# Patient Record
Sex: Female | Born: 1975 | Race: Black or African American | Hispanic: No | Marital: Single | Smoking: Never smoker
Health system: Southern US, Community
[De-identification: ages and names within clinical notes are randomized; demographics above are authoritative.]

## PROBLEM LIST (undated history)

## (undated) DIAGNOSIS — I82409 Acute embolism and thrombosis of unspecified deep veins of unspecified lower extremity: Secondary | ICD-10-CM

## (undated) DIAGNOSIS — K5900 Constipation, unspecified: Secondary | ICD-10-CM

## (undated) DIAGNOSIS — R5383 Other fatigue: Secondary | ICD-10-CM

## (undated) DIAGNOSIS — R12 Heartburn: Secondary | ICD-10-CM

## (undated) DIAGNOSIS — R0602 Shortness of breath: Secondary | ICD-10-CM

## (undated) DIAGNOSIS — E559 Vitamin D deficiency, unspecified: Secondary | ICD-10-CM

## (undated) DIAGNOSIS — M25569 Pain in unspecified knee: Secondary | ICD-10-CM

## (undated) HISTORY — DX: Heartburn: R12

## (undated) HISTORY — DX: Acute embolism and thrombosis of unspecified deep veins of unspecified lower extremity: I82.409

## (undated) HISTORY — DX: Constipation, unspecified: K59.00

## (undated) HISTORY — DX: Vitamin D deficiency, unspecified: E55.9

## (undated) HISTORY — DX: Other fatigue: R53.83

## (undated) HISTORY — PX: BREAST REDUCTION SURGERY: SHX8

## (undated) HISTORY — PX: BREAST CYST EXCISION: SHX579

## (undated) HISTORY — PX: PARTIAL HYSTERECTOMY: SHX80

## (undated) HISTORY — DX: Pain in unspecified knee: M25.569

## (undated) HISTORY — DX: Shortness of breath: R06.02

---

## 2017-09-12 DIAGNOSIS — Z719 Counseling, unspecified: Secondary | ICD-10-CM | POA: Diagnosis not present

## 2017-11-07 ENCOUNTER — Other Ambulatory Visit: Payer: Self-pay | Admitting: Obstetrics and Gynecology

## 2017-11-07 DIAGNOSIS — Z1231 Encounter for screening mammogram for malignant neoplasm of breast: Secondary | ICD-10-CM

## 2017-11-26 DIAGNOSIS — Z124 Encounter for screening for malignant neoplasm of cervix: Secondary | ICD-10-CM | POA: Diagnosis not present

## 2017-11-26 DIAGNOSIS — Z01419 Encounter for gynecological examination (general) (routine) without abnormal findings: Secondary | ICD-10-CM | POA: Diagnosis not present

## 2017-11-26 DIAGNOSIS — Z6833 Body mass index (BMI) 33.0-33.9, adult: Secondary | ICD-10-CM | POA: Diagnosis not present

## 2017-12-06 DIAGNOSIS — J019 Acute sinusitis, unspecified: Secondary | ICD-10-CM | POA: Diagnosis not present

## 2017-12-19 ENCOUNTER — Ambulatory Visit
Admission: RE | Admit: 2017-12-19 | Discharge: 2017-12-19 | Disposition: A | Discharge status: Home / Self Care | Payer: 59 | Source: Ambulatory Visit | Attending: Obstetrics and Gynecology | Admitting: Obstetrics and Gynecology

## 2017-12-19 ENCOUNTER — Encounter: Payer: Self-pay | Admitting: Radiology

## 2017-12-19 DIAGNOSIS — Z1231 Encounter for screening mammogram for malignant neoplasm of breast: Secondary | ICD-10-CM

## 2018-01-15 DIAGNOSIS — J069 Acute upper respiratory infection, unspecified: Secondary | ICD-10-CM | POA: Diagnosis not present

## 2018-01-19 DIAGNOSIS — R091 Pleurisy: Secondary | ICD-10-CM | POA: Diagnosis not present

## 2018-01-19 DIAGNOSIS — J069 Acute upper respiratory infection, unspecified: Secondary | ICD-10-CM | POA: Diagnosis not present

## 2018-02-05 DIAGNOSIS — J209 Acute bronchitis, unspecified: Secondary | ICD-10-CM | POA: Diagnosis not present

## 2018-02-05 DIAGNOSIS — K649 Unspecified hemorrhoids: Secondary | ICD-10-CM | POA: Diagnosis not present

## 2018-02-17 ENCOUNTER — Encounter (HOSPITAL_COMMUNITY): Payer: Self-pay | Admitting: Emergency Medicine

## 2018-02-17 ENCOUNTER — Emergency Department (HOSPITAL_COMMUNITY): Payer: 59

## 2018-02-17 ENCOUNTER — Emergency Department (HOSPITAL_COMMUNITY)
Admission: EM | Admit: 2018-02-17 | Discharge: 2018-02-17 | Disposition: A | Discharge status: Home / Self Care | Payer: 59 | Attending: Emergency Medicine | Admitting: Emergency Medicine

## 2018-02-17 DIAGNOSIS — M940 Chondrocostal junction syndrome [Tietze]: Secondary | ICD-10-CM | POA: Insufficient documentation

## 2018-02-17 DIAGNOSIS — R05 Cough: Secondary | ICD-10-CM | POA: Diagnosis not present

## 2018-02-17 MED ORDER — NAPROXEN SODIUM 550 MG PO TABS: 550.0000 mg | ORAL_TABLET | Freq: Two times a day (BID) | ORAL | 0 refills | Status: AC

## 2018-02-17 NOTE — ED Notes (Signed)
Patient verbalized understanding of dc instructions, vss, ambulatory with nad.   

## 2018-02-17 NOTE — ED Provider Notes (Signed)
[facility] EMERGENCY DEPARTMENT Provider Note   CSN: [id number] Arrival date & time: 02/17/18  0807     History   Chief Complaint Chief Complaint  Patient presents with  . Cough    HPI Jessica Frye is a 43 y.o. female.  43 year old female presents with complaint of pain in her ribs with coughing and sneezing, located right and left lower ribs, no pain at rest (only occurs with coughing/sneezing).  Patient states that she has had a cough for the past month, reports onset with postnasal drip as a general cold which progressed into worsening cough.  Patient did a virtual health visit and was given cough medicine.  Patient did a second virtual visit when her cough was persisting and was given a round of steroids.  Patient went to urgent care for ongoing cough was diagnosed with bronchitis and given additional steroids and antibiotics.  Patient states cough is not productive, denies associated fevers, chills, sweats.  Patient reports slight wheezing only with prolonged coughing episodes.  Non-smoker. No other complaints or concerns.      History reviewed. No pertinent past medical history.  There are no active problems to display for this patient.   Past Surgical History:  Procedure Laterality Date  . BREAST CYST EXCISION Right      OB History   No obstetric history on file.      Home Medications    Prior to Admission medications   Medication Sig Start Date End Date Taking? Authorizing Provider  naproxen sodium (ANAPROX) 550 MG tablet Take 1 tablet (550 mg total) by mouth 2 (two) times daily with a meal for 10 days. 02/17/18 02/27/18  Jeannie Fend, PA-C    Family History Family History  Problem Relation Age of Onset  . Breast cancer Neg Hx     Social History Social History   Tobacco Use  . Smoking status: Not on file  Substance Use Topics  . Alcohol use: Not on file  . Drug use: Not on file     Allergies   Patient has no known  allergies.   Review of Systems Review of Systems  Constitutional: Negative for chills and fever.  HENT: Positive for sneezing. Negative for congestion, ear pain, postnasal drip, rhinorrhea, sinus pressure, sinus pain, sore throat, trouble swallowing and voice change.   Eyes: Negative for discharge and redness.  Respiratory: Positive for cough and wheezing. Negative for shortness of breath.   Musculoskeletal: Negative for arthralgias and myalgias.  Skin: Negative for rash and wound.  Allergic/Immunologic: Negative for immunocompromised state.  Neurological: Negative for weakness and headaches.  Hematological: Negative for adenopathy.  All other systems reviewed and are negative.    Physical Exam Updated Vital Signs BP 111/79   Pulse 84   Temp 98.3 F (36.8 C) (Oral)   Resp 16   SpO2 100%   Physical Exam Vitals signs and nursing note reviewed.  Constitutional:      General: She is not in acute distress.    Appearance: She is well-developed. She is not diaphoretic.  HENT:     Head: Normocephalic and atraumatic.     Nose: Nose normal.     Mouth/Throat:     Mouth: Mucous membranes are moist.  Eyes:     Conjunctiva/sclera: Conjunctivae normal.  Cardiovascular:     Rate and Rhythm: Normal rate and regular rhythm.     Pulses: Normal pulses.     Heart sounds: Normal heart sounds. No murmur.  Pulmonary:  Effort: Pulmonary effort is normal.     Breath sounds: Normal breath sounds. No wheezing.  Chest:     Chest wall: Tenderness present.    Abdominal:     Tenderness: There is no abdominal tenderness.  Skin:    General: Skin is warm and dry.     Findings: No erythema or rash.  Neurological:     Mental Status: She is alert and oriented to person, place, and time.  Psychiatric:        Behavior: Behavior normal.      ED Treatments / Results  Labs (all labs ordered are listed, but only abnormal results are displayed) Labs Reviewed - No data to  display  EKG None  Radiology Dg Chest 2 View  Result Date: 02/17/2018 CLINICAL DATA:  One-week history of cough, shows pain and shortness of breath. EXAM: CHEST - 2 VIEW COMPARISON:  None. FINDINGS: Cardiomediastinal silhouette unremarkable. Lungs clear. Bronchovascular markings normal. Pulmonary vascularity normal. No pneumothorax. No pleural effusions. Visualized bony thorax intact. IMPRESSION: Normal examination. Electronically Signed   By: Hulan Saas M.D.   On: 02/17/2018 09:17    Procedures Procedures (including critical care time)  Medications Ordered in ED Medications - No data to display   Initial Impression / Assessment and Plan / ED Course  I have reviewed the triage vital signs and the nursing notes.  Pertinent labs & imaging results that were available during my care of the patient were reviewed by me and considered in my medical decision making (see chart for details).  Clinical Course as of Feb 17 933  Sun Feb 17, 2018  2970 43 year old female with cough x1 month now with chest wall soreness.  Pain reproduced with coughing, sneezing, palpation.  X-ray unremarkable.  Patient given course of naproxen to take for likely costochondritis and advised to follow-up with her PCP.   [LM]    Clinical Course User Index [LM] Jeannie Fend, PA-C   Final Clinical Impressions(s) / ED Diagnoses   Final diagnoses:  Costochondritis    ED Discharge Orders         Ordered    naproxen sodium (ANAPROX) 550 MG tablet  2 times daily with meals     02/17/18 0933           Jeannie Fend, PA-C 02/17/18 0935    Virgina Norfolk, DO 02/17/18 [phone]

## 2018-02-17 NOTE — ED Notes (Signed)
Patient transported to X-ray 

## 2018-02-17 NOTE — Discharge Instructions (Addendum)
Your chest x-ray today is normal.  Your pain is likely coming from muscle soreness or inflammation of the cartilage in your ribs.  You should take naproxen as prescribed for 10 days.  Take this medication with food.  Follow-up with your doctor this week.

## 2018-02-17 NOTE — ED Triage Notes (Signed)
Pt reports seen multiple times over the past 4 weeks for a cough that was productive and is now dry. States that her chest is sore now from coughing so much. States she was initially given tessalon for cough, when it did not subside she went to urgent care where she was told it was related to allergies, given steroids, cough did not subside,  2 weeks later diagnosed with bronchitis, given another course of steroids and abx. She reports cough is still persisting.

## 2018-02-19 DIAGNOSIS — E559 Vitamin D deficiency, unspecified: Secondary | ICD-10-CM | POA: Diagnosis not present

## 2018-02-19 DIAGNOSIS — R05 Cough: Secondary | ICD-10-CM | POA: Diagnosis not present

## 2018-02-19 DIAGNOSIS — R5383 Other fatigue: Secondary | ICD-10-CM | POA: Diagnosis not present

## 2018-03-28 DIAGNOSIS — J019 Acute sinusitis, unspecified: Secondary | ICD-10-CM | POA: Diagnosis not present

## 2018-04-19 DIAGNOSIS — J018 Other acute sinusitis: Secondary | ICD-10-CM | POA: Diagnosis not present

## 2018-12-09 ENCOUNTER — Other Ambulatory Visit: Payer: Self-pay | Admitting: Obstetrics and Gynecology

## 2018-12-09 DIAGNOSIS — Z1231 Encounter for screening mammogram for malignant neoplasm of breast: Secondary | ICD-10-CM

## 2019-01-02 ENCOUNTER — Ambulatory Visit: Payer: 59

## 2019-03-14 ENCOUNTER — Ambulatory Visit: Payer: 59 | Attending: Internal Medicine

## 2019-03-14 DIAGNOSIS — Z23 Encounter for immunization: Secondary | ICD-10-CM

## 2019-03-14 NOTE — Progress Notes (Signed)
   Covid-19 Vaccination Clinic  Name:  Jessica Frye    MRN: [id number] DOB: August 12, 1975  03/14/2019  [name] was observed post Covid-19 immunization for 15 minutes without incident. She was provided with Vaccine Information Sheet and instruction to access the V-Safe system.   [name] was instructed to call 911 with any severe reactions post vaccine: Marland Kitchen Difficulty breathing  . Swelling of face and throat  . A fast heartbeat  . A bad rash all over body  . Dizziness and weakness   Immunizations Administered    Name Date Dose VIS Date Route   Pfizer COVID-19 Vaccine 03/14/2019  9:13 AM 0.3 mL 12/13/2018 Intramuscular   Manufacturer: [employer], [employer]   Lot: [id number]   NDC: [address]-3

## 2019-04-03 ENCOUNTER — Ambulatory Visit: Payer: 59

## 2019-04-07 ENCOUNTER — Ambulatory Visit: Payer: 59 | Attending: Internal Medicine

## 2019-04-07 DIAGNOSIS — Z23 Encounter for immunization: Secondary | ICD-10-CM

## 2019-04-07 NOTE — Progress Notes (Signed)
   Covid-19 Vaccination Clinic  Name:  Tameaka Eichhorn    MRN: [id number] DOB: March 31, 1975  04/07/2019  Ms. Kirkey was observed post Covid-19 immunization for 15 minutes without incident. She was provided with Vaccine Information Sheet and instruction to access the V-Safe system.   Ms. Rzasa was instructed to call 911 with any severe reactions post vaccine: Marland Kitchen Difficulty breathing  . Swelling of face and throat  . A fast heartbeat  . A bad rash all over body  . Dizziness and weakness   Immunizations Administered    Name Date Dose VIS Date Route   Pfizer COVID-19 Vaccine 04/07/2019  9:48 AM 0.3 mL 12/13/2018 Intramuscular   Manufacturer: [employer], [employer]   Lot: [id number]   NDC: [address]-9

## 2019-08-22 ENCOUNTER — Ambulatory Visit: Payer: 59

## 2019-09-05 IMAGING — MG DIGITAL SCREENING BILATERAL MAMMOGRAM WITH TOMO AND CAD
6 of 10 series · 6 of 30 positions shown · non-contrast
Comparison: Previous exam(s).

CLINICAL DATA: Screening.

EXAM:
DIGITAL SCREENING BILATERAL MAMMOGRAM WITH TOMO AND CAD

[R CC synth-2D (1 of 2)]
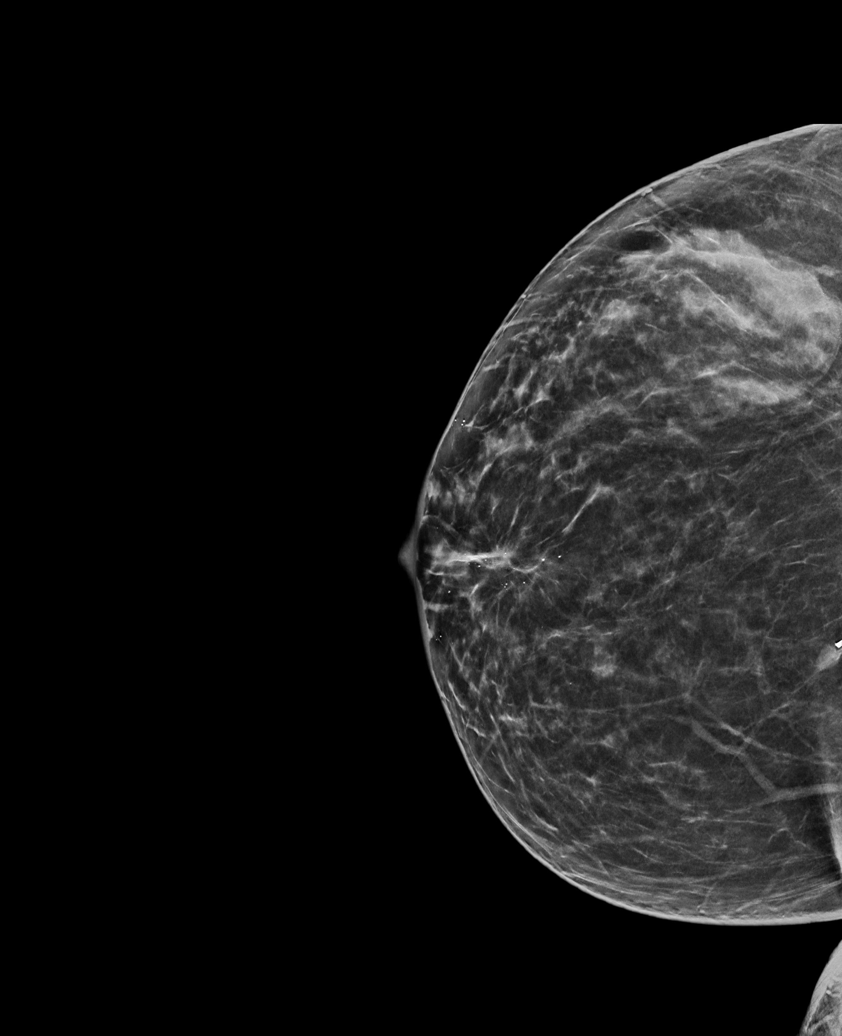

[R CC synth-2D (2 of 2)]
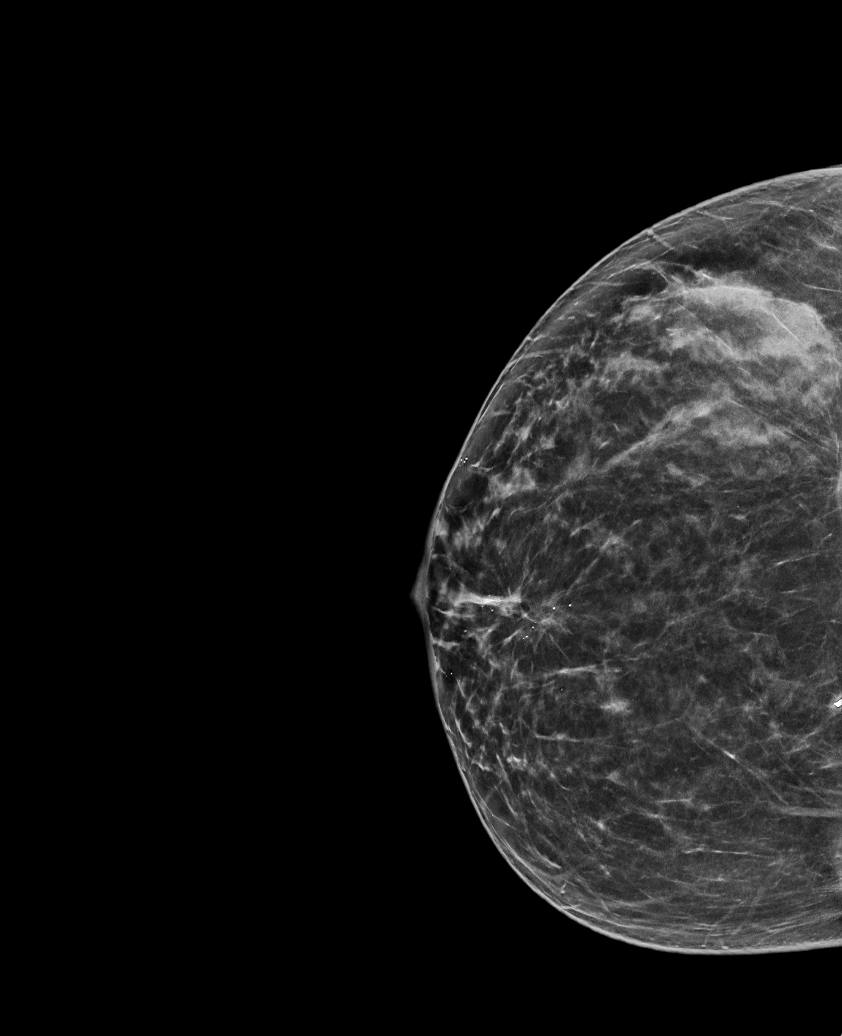

[L MLO synth-2D]
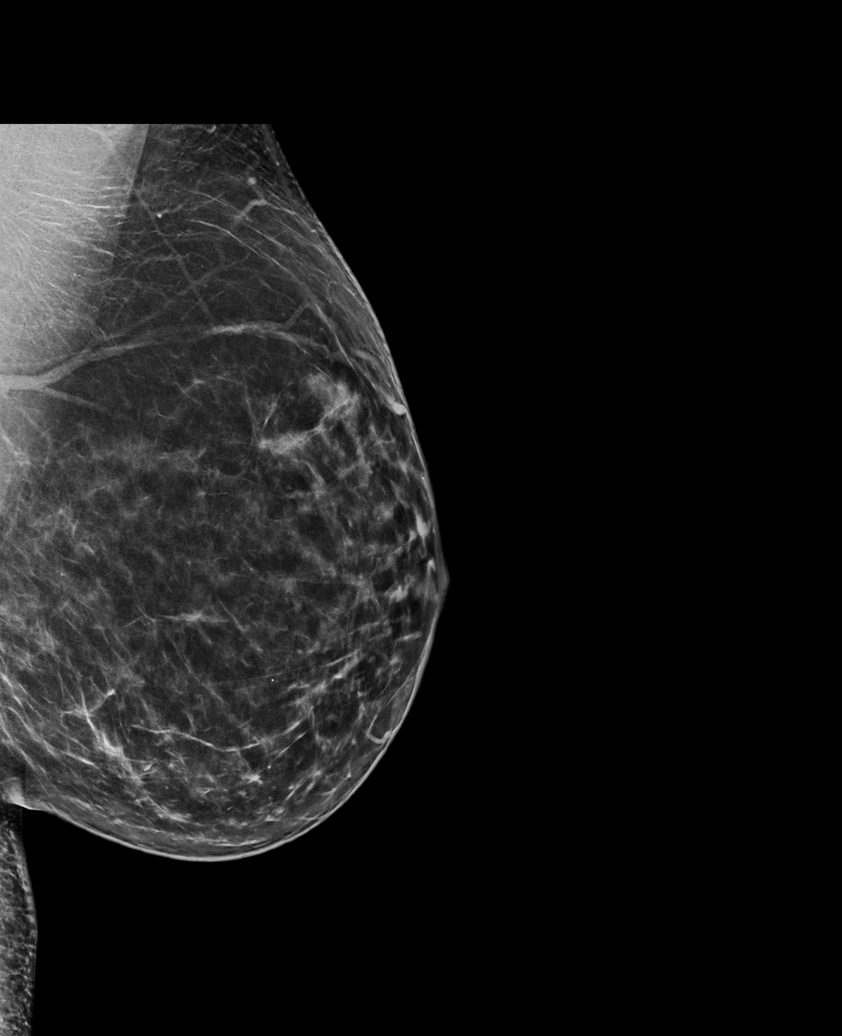

[R MLO synth-2D]
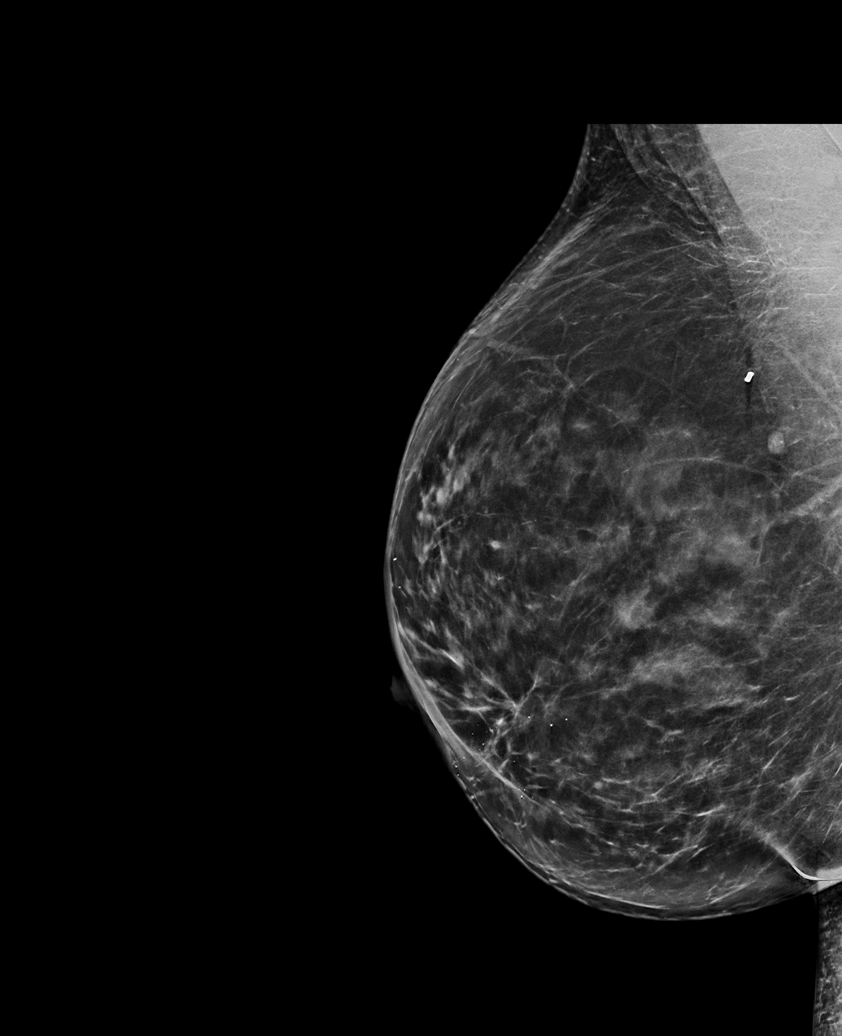

[L CC synth-2D]
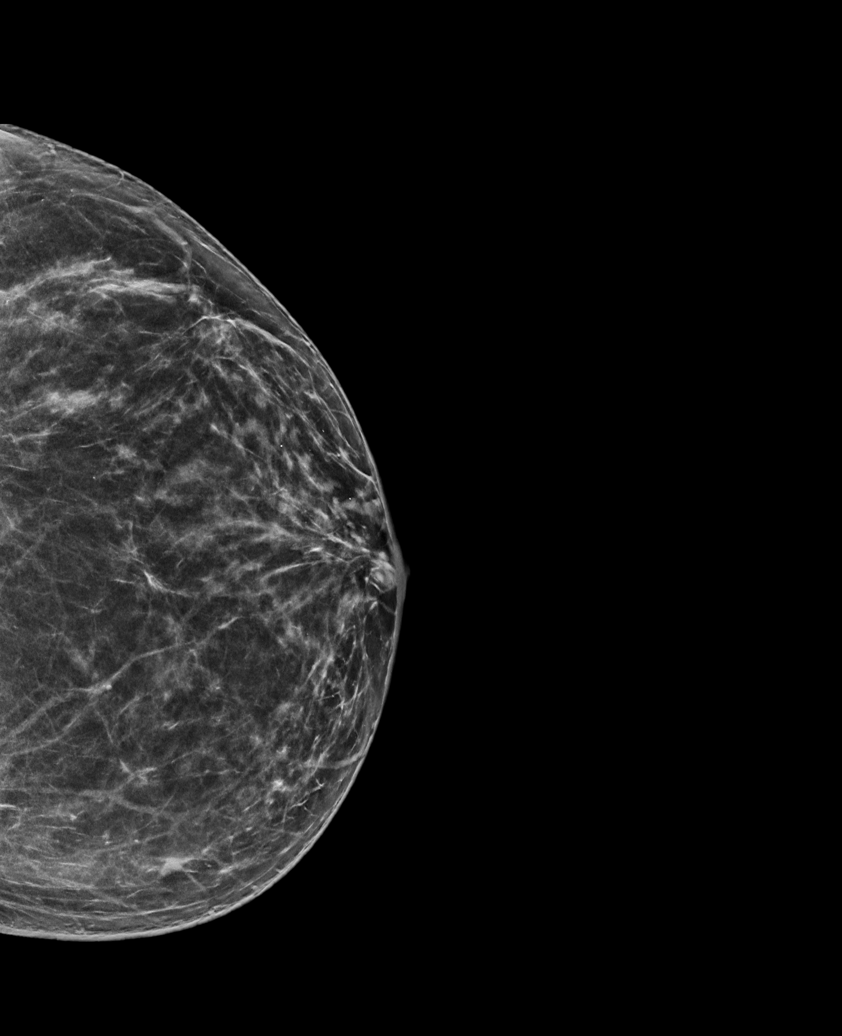

[L CC tomo · tomo slice 35/70.0]
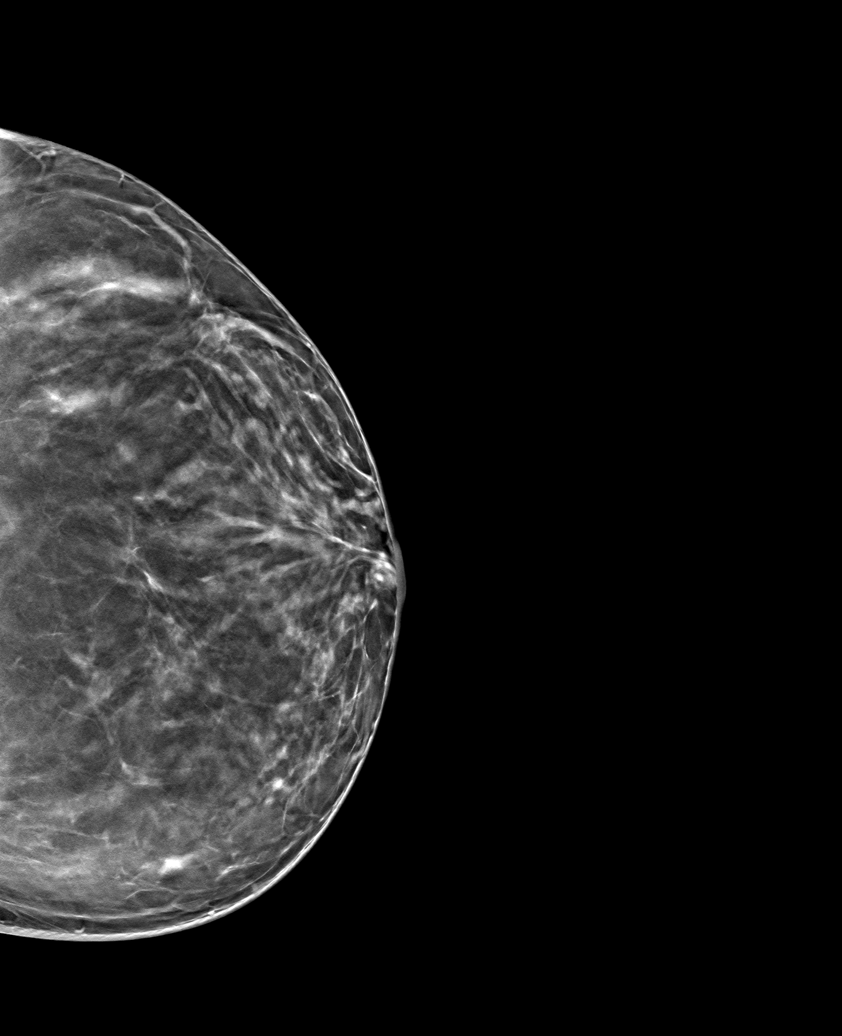

[6 of 30 positions shown; findings below may reference images not displayed]

ACR Breast Density Category b: There are scattered areas of
fibroglandular density.
FINDINGS: There are no findings suspicious for malignancy. Images were
processed with CAD.
IMPRESSION: No mammographic evidence of malignancy. A result letter of this
screening mammogram will be mailed directly to the patient.

RECOMMENDATION:
Screening mammogram in one year. (Code:[id number])

BI-RADS CATEGORY  1: Negative.

## 2019-09-09 ENCOUNTER — Other Ambulatory Visit: Payer: Self-pay

## 2019-09-09 ENCOUNTER — Ambulatory Visit
Admission: RE | Admit: 2019-09-09 | Discharge: 2019-09-09 | Disposition: A | Discharge status: Home / Self Care | Payer: 59 | Source: Ambulatory Visit | Attending: Obstetrics and Gynecology | Admitting: Obstetrics and Gynecology

## 2019-09-09 DIAGNOSIS — Z1231 Encounter for screening mammogram for malignant neoplasm of breast: Secondary | ICD-10-CM

## 2020-03-30 ENCOUNTER — Emergency Department (HOSPITAL_BASED_OUTPATIENT_CLINIC_OR_DEPARTMENT_OTHER): Payer: 59

## 2020-03-30 ENCOUNTER — Encounter (HOSPITAL_BASED_OUTPATIENT_CLINIC_OR_DEPARTMENT_OTHER): Payer: Self-pay | Admitting: Emergency Medicine

## 2020-03-30 ENCOUNTER — Other Ambulatory Visit: Payer: Self-pay

## 2020-03-30 ENCOUNTER — Emergency Department (HOSPITAL_BASED_OUTPATIENT_CLINIC_OR_DEPARTMENT_OTHER)
Admission: EM | Admit: 2020-03-30 | Discharge: 2020-03-30 | Disposition: A | Discharge status: Home / Self Care | Payer: 59 | Attending: Emergency Medicine | Admitting: Emergency Medicine

## 2020-03-30 DIAGNOSIS — I82442 Acute embolism and thrombosis of left tibial vein: Secondary | ICD-10-CM | POA: Diagnosis not present

## 2020-03-30 DIAGNOSIS — M79662 Pain in left lower leg: Secondary | ICD-10-CM | POA: Diagnosis present

## 2020-03-30 LAB — CBC WITH DIFFERENTIAL/PLATELET
Abs Immature Granulocytes: 0.01 10*3/uL (ref 0.00–0.07)
Basophils Absolute: 0 10*3/uL (ref 0.0–0.1)
Basophils Relative: 1 %
Eosinophils Absolute: 0.1 10*3/uL (ref 0.0–0.5)
Eosinophils Relative: 3 %
HCT: 39.9 % (ref 36.0–46.0)
Hemoglobin: 13.1 g/dL (ref 12.0–15.0)
Immature Granulocytes: 0 %
Lymphocytes Relative: 36 %
Lymphs Abs: 1.7 10*3/uL (ref 0.7–4.0)
MCH: 28.6 pg (ref 26.0–34.0)
MCHC: 32.8 g/dL (ref 30.0–36.0)
MCV: 87.1 fL (ref 80.0–100.0)
Monocytes Absolute: 0.3 10*3/uL (ref 0.1–1.0)
Monocytes Relative: 7 %
Neutro Abs: 2.6 10*3/uL (ref 1.7–7.7)
Neutrophils Relative %: 53 %
Platelets: 323 10*3/uL (ref 150–400)
RBC: 4.58 MIL/uL (ref 3.87–5.11)
RDW: 11.9 % (ref 11.5–15.5)
WBC: 4.8 10*3/uL (ref 4.0–10.5)
nRBC: 0 % (ref 0.0–0.2)

## 2020-03-30 LAB — BASIC METABOLIC PANEL
Anion gap: 7 (ref 5–15)
BUN: 11 mg/dL (ref 6–20)
CO2: 27 mmol/L (ref 22–32)
Calcium: 9.9 mg/dL (ref 8.9–10.3)
Chloride: 103 mmol/L (ref 98–111)
Creatinine, Ser: 0.76 mg/dL (ref 0.44–1.00)
GFR, Estimated: >60 mL/min (ref >60)
Glucose, Bld: 84 mg/dL (ref 70–99)
Potassium: 3.8 mmol/L (ref 3.5–5.1)
Sodium: 137 mmol/L (ref 135–145)

## 2020-03-30 MED ORDER — RIVAROXABAN (XARELTO) VTE STARTER PACK (15 & 20 MG): ORAL_TABLET | ORAL | 0 refills | Status: DC

## 2020-03-30 MED ORDER — RIVAROXABAN 15 MG PO TABS
15.0000 mg | ORAL_TABLET | Freq: Once | ORAL | Status: AC
  Administered 2020-03-30: 15 mg via ORAL
  Filled 2020-03-30: qty 1

## 2020-03-30 NOTE — ED Notes (Signed)
Patient transported to Ultrasound 

## 2020-03-30 NOTE — ED Provider Notes (Signed)
[facility] EMERGENCY DEPT Provider Note   CSN: [id number] Arrival date & time: 03/30/20  1713     History Chief Complaint  Patient presents with  . Leg Pain    Jessica Frye is a 45 y.o. female.  Jessica Frye reports that she was getting into bed about 4 days ago when her left calf muscle began to cramp.  Ever since then she has had persistent pain in this leg.  She denies any changes to her routines.  She has not engaged in strenuous exercise.  No history of DVT or PE.  The history is provided by the patient.  Leg Pain Location:  Leg Leg location:  L lower leg Pain details:    Quality:  Cramping   Radiates to:  Does not radiate   Severity:  Moderate   Onset quality:  Sudden   Duration:  4 days   Timing:  Constant   Progression:  Unchanged Chronicity:  New Dislocation: no   Prior injury to area:  No Relieved by:  Rest Worsened by:  Bearing weight Associated symptoms: no back pain, no decreased ROM, no fever, no numbness and no swelling        History reviewed. No pertinent past medical history.  There are no problems to display for this patient.   Past Surgical History:  Procedure Laterality Date  . BREAST CYST EXCISION Right      OB History   No obstetric history on file.     Family History  Problem Relation Age of Onset  . Breast cancer Neg Hx     Social History   Substance Use Topics  . Alcohol use: Not Currently    Home Medications Prior to Admission medications   Not on File    Allergies    Patient has no known allergies.  Review of Systems   Review of Systems  Constitutional: Negative for chills and fever.  HENT: Negative for ear pain and sore throat.   Eyes: Negative for pain and visual disturbance.  Respiratory: Negative for cough and shortness of breath.   Cardiovascular: Negative for chest pain and palpitations.  Gastrointestinal: Negative for abdominal pain and vomiting.  Genitourinary: Negative for dysuria and  hematuria.  Musculoskeletal: Negative for arthralgias and back pain.  Skin: Negative for color change and rash.  Neurological: Negative for seizures and syncope.  All other systems reviewed and are negative.   Physical Exam Updated Vital Signs BP 132/85 (BP Location: Left Arm)   Pulse 80   Temp 98.2 F (36.8 C) (Oral)   Resp 18   Ht 5\' 2"  (1.575 m)   Wt 73.5 kg   SpO2 100%   BMI 29.63 kg/m   Physical Exam Vitals and nursing note reviewed.  HENT:     Head: Normocephalic and atraumatic.  Eyes:     General: No scleral icterus. Pulmonary:     Effort: Pulmonary effort is normal. No respiratory distress.  Musculoskeletal:     Cervical back: Normal range of motion.       Legs:     Comments: Tenderness to palpation at the posterior aspect of the left calf.  No muscular defect noted.  No tenderness to palpation about the Achilles tendon.  Ankle range of motion is normal, and dorsiflexion more so than plantar flexion produces some mild pain.  Dorsalis pedis and posterior tibialis pulses are normal.  No obvious edema.  No swelling in the popliteal fossa.  Skin:    General: Skin is warm  and dry.  Neurological:     Mental Status: She is alert.  Psychiatric:        Mood and Affect: Mood normal.     ED Results / Procedures / Treatments   Labs (all labs ordered are listed, but only abnormal results are displayed) Labs Reviewed  BASIC METABOLIC PANEL  CBC WITH DIFFERENTIAL/PLATELET    EKG None  Radiology US Venous Img Lower  Left (DVT Study)  Addendum Date: 03/30/2020   ADDENDUM REPORT: 03/30/2020 19:33 ADDENDUM: These results were called by telephone at the time of physician contact on 03/30/2020 at 7:33 pm to provider [facility] , who verbally acknowledged these results. Electronically Signed   By: Kreg Shropshire M.D.   On: 03/30/2020 19:33   Result Date: 03/30/2020 CLINICAL DATA:  Left leg swell EXAM: LEFT LOWER EXTREMITY VENOUS DOPPLER ULTRASOUND TECHNIQUE: Gray-scale  sonography with compression, as well as color and duplex ultrasound, were performed to evaluate the deep venous system(s) from the level of the common femoral vein through the popliteal and proximal calf veins. COMPARISON:  None. FINDINGS: VENOUS Corresponding well to the patient's area indicated discomfort is a focal the noncompressible segment of a posterior tibial vein. Normal compressibility of the common femoral, superficial femoral, and popliteal veins, as well as the remaining visualized calf veins. Visualized portions of profunda femoral vein and great saphenous vein unremarkable. No filling defects to suggest DVT on grayscale or color Doppler imaging. Doppler waveforms show normal direction of venous flow, normal respiratory plasticity and response to augmentation. Limited views of the contralateral common femoral vein are unremarkable. OTHER None. Limitations: none IMPRESSION: Segmental noncompressibility of the posterior tibial vein at the calf corresponding to patient's area of concern could reflect a small deep venous thrombosis within 1 of 2 left posterior tibial veins. No other visible femoropopliteal DVT or additional filling defects within the remaining calf veins in the left lower extremity. Electronically Signed: By: Kreg Shropshire M.D. On: 03/30/2020 19:25    Procedures Procedures   Medications Ordered in ED Medications  Rivaroxaban (XARELTO) tablet 15 mg (has no administration in time range)    ED Course  I have reviewed the triage vital signs and the nursing notes.  Pertinent labs & imaging results that were available during my care of the patient were reviewed by me and considered in my medical decision making (see chart for details).    MDM Rules/Calculators/A&P                          Jessica Frye presents with left posterior leg pain.  Initially I was considering a musculoskeletal cause such as muscle cramping, microscopic tearing of the musculature, or even Achilles  tendon pathology.  Nonetheless, I do want to exclude DVT.  Ultrasound returned positive for DVT.  Labs were then ordered to make sure the patient did not have any renal failure.  Labs were within normal limits.  She was given a dose of Xarelto here, she was discharged home with a prescription.  I talked to her extensively about the risk and benefits of anticoagulation.  She agrees to follow-up with her doctor. Final Clinical Impression(s) / ED Diagnoses Final diagnoses:  Acute deep vein thrombosis (DVT) of tibial vein of left lower extremity (HCC)    Rx / DC Orders ED Discharge Orders         Ordered    RIVAROXABAN (XARELTO) VTE STARTER PACK (15 & 20 MG)  03/30/20 2100           Koleen Distance, MD 03/30/20 2102

## 2020-03-30 NOTE — ED Triage Notes (Signed)
Pt stated that she is experiencing constant pain in her left calf for the past three days. Pt states that her calf is not hot to the touch and is unsure if there is any swelling

## 2020-03-31 ENCOUNTER — Emergency Department (HOSPITAL_BASED_OUTPATIENT_CLINIC_OR_DEPARTMENT_OTHER)
Admission: EM | Admit: 2020-03-31 | Discharge: 2020-03-31 | Disposition: A | Discharge status: Home / Self Care | Payer: 59 | Attending: Emergency Medicine | Admitting: Emergency Medicine

## 2020-03-31 ENCOUNTER — Encounter (HOSPITAL_BASED_OUTPATIENT_CLINIC_OR_DEPARTMENT_OTHER): Payer: Self-pay | Admitting: Emergency Medicine

## 2020-03-31 ENCOUNTER — Other Ambulatory Visit: Payer: Self-pay

## 2020-03-31 DIAGNOSIS — Z7901 Long term (current) use of anticoagulants: Secondary | ICD-10-CM | POA: Diagnosis not present

## 2020-03-31 DIAGNOSIS — I82890 Acute embolism and thrombosis of other specified veins: Secondary | ICD-10-CM

## 2020-03-31 DIAGNOSIS — M79605 Pain in left leg: Secondary | ICD-10-CM | POA: Diagnosis present

## 2020-03-31 DIAGNOSIS — I82812 Embolism and thrombosis of superficial veins of left lower extremities: Secondary | ICD-10-CM | POA: Insufficient documentation

## 2020-03-31 NOTE — ED Triage Notes (Signed)
Worsening left cal pain, Dx DVT, sent by PCP for arterial doppler for possible occlusion.

## 2020-03-31 NOTE — ED Provider Notes (Signed)
[facility] EMERGENCY DEPT Provider Note   CSN: [id number] Arrival date & time: 03/31/20  1143     History Chief Complaint  Patient presents with  . Leg Pain    Jessica Frye is a 45 y.o. female.  45 yo F with a chief complaints of leg pain.  This been off and on for some time now.  She was seen yesterday in the emergency department and found to have a superficial thrombophlebitis.  Decision was made to anticoagulate her.  She was followed up with her primary doctor today who was unable to palpate pulses and so sent her here to rule out acute arterial occlusion.  She denies any significant change in her pain over the past couple days.  Pain usually worse with bearing weight and ambulating.  The history is provided by the patient.  Leg Pain Location:  Leg Time since incident:  2 months Injury: no   Leg location:  L leg Pain details:    Quality:  Cramping and aching   Radiates to:  Does not radiate   Severity:  Moderate   Onset quality:  Gradual   Duration:  2 months   Timing:  Intermittent   Progression:  Waxing and waning Chronicity:  New Prior injury to area:  No Relieved by:  Nothing Worsened by:  Nothing Ineffective treatments:  None tried Associated symptoms: no fever        History reviewed. No pertinent past medical history.  There are no problems to display for this patient.   Past Surgical History:  Procedure Laterality Date  . BREAST CYST EXCISION Right      OB History   No obstetric history on file.     Family History  Problem Relation Age of Onset  . Breast cancer Neg Hx     Social History   Substance Use Topics  . Alcohol use: Not Currently    Home Medications Prior to Admission medications   Medication Sig Start Date End Date Taking? Authorizing Provider  RIVAROXABAN Carlena Hurl) VTE STARTER PACK (15 & 20 MG) Follow package directions: Take one 15mg  tablet by mouth twice a day. On day 22, switch to one 20mg  tablet once a  day. Take with food. 03/30/20   , MD    Allergies    Patient has no known allergies.  Review of Systems   Review of Systems  Constitutional: Negative for chills and fever.  HENT: Negative for congestion and rhinorrhea.   Eyes: Negative for redness and visual disturbance.  Respiratory: Negative for shortness of breath and wheezing.   Cardiovascular: Negative for chest pain and palpitations.  Gastrointestinal: Negative for nausea and vomiting.  Genitourinary: Negative for dysuria and urgency.  Musculoskeletal: Positive for myalgias. Negative for arthralgias.  Skin: Negative for pallor and wound.  Neurological: Negative for dizziness and headaches.    Physical Exam Updated Vital Signs BP 112/85   Pulse 80   Temp 98.6 F (37 C) (Oral)   Resp 16   Ht 5\' 2"  (1.575 m)   Wt 78 kg   SpO2 100%   BMI 31.46 kg/m   Physical Exam Vitals and nursing note reviewed.  Constitutional:      General: She is not in acute distress.    Appearance: She is well-developed. She is not diaphoretic.  HENT:     Head: Normocephalic and atraumatic.  Eyes:     Pupils: Pupils are equal, round, and reactive to light.  Cardiovascular:  Rate and Rhythm: Normal rate and regular rhythm.     Heart sounds: No murmur heard. No friction rub. No gallop.   Pulmonary:     Effort: Pulmonary effort is normal.     Breath sounds: No wheezing or rales.  Abdominal:     General: There is no distension.     Palpations: Abdomen is soft.     Tenderness: There is no abdominal tenderness.  Musculoskeletal:        General: Tenderness present.     Cervical back: Normal range of motion and neck supple.     Comments: No obvious edema comparing one leg to the other.  Mild pain to the left calf.  No discoloration.  Intact hair growth all the way to the ankle.  Cap refill for me is less than 2 seconds in all digits of the left foot.  I do not palpate a dorsalis pedis pulse on either foot.  She does have a  readily palpable posterior tibialis pulse on the right and slightly less obvious on the left.  Doppler with triphasic pulse.  Skin:    General: Skin is warm and dry.  Neurological:     Mental Status: She is alert and oriented to person, place, and time.  Psychiatric:        Behavior: Behavior normal.     ED Results / Procedures / Treatments   Labs (all labs ordered are listed, but only abnormal results are displayed) Labs Reviewed - No data to display  EKG None  Radiology US Venous Img Lower  Left (DVT Study)  Addendum Date: 03/30/2020   ADDENDUM REPORT: 03/30/2020 19:33 ADDENDUM: These results were called by telephone at the time of physician contact on 03/30/2020 at 7:33 pm to provider [facility] , who verbally acknowledged these results. Electronically Signed   By: Kreg Shropshire M.D.   On: 03/30/2020 19:33   Result Date: 03/30/2020 CLINICAL DATA:  Left leg swell EXAM: LEFT LOWER EXTREMITY VENOUS DOPPLER ULTRASOUND TECHNIQUE: Gray-scale sonography with compression, as well as color and duplex ultrasound, were performed to evaluate the deep venous system(s) from the level of the common femoral vein through the popliteal and proximal calf veins. COMPARISON:  None. FINDINGS: VENOUS Corresponding well to the patient's area indicated discomfort is a focal the noncompressible segment of a posterior tibial vein. Normal compressibility of the common femoral, superficial femoral, and popliteal veins, as well as the remaining visualized calf veins. Visualized portions of profunda femoral vein and great saphenous vein unremarkable. No filling defects to suggest DVT on grayscale or color Doppler imaging. Doppler waveforms show normal direction of venous flow, normal respiratory plasticity and response to augmentation. Limited views of the contralateral common femoral vein are unremarkable. OTHER None. Limitations: none IMPRESSION: Segmental noncompressibility of the posterior tibial vein at the calf  corresponding to patient's area of concern could reflect a small deep venous thrombosis within 1 of 2 left posterior tibial veins. No other visible femoropopliteal DVT or additional filling defects within the remaining calf veins in the left lower extremity. Electronically Signed: By: Kreg Shropshire M.D. On: 03/30/2020 19:25    Procedures Procedures   Medications Ordered in ED Medications - No data to display  ED Course  I have reviewed the triage vital signs and the nursing notes.  Pertinent labs & imaging results that were available during my care of the patient were reviewed by me and considered in my medical decision making (see chart for details).    MDM  Rules/Calculators/A&P                          45 yo F with a chief complaints of left leg pain and cramping.  This is been off and on for months.  She was seen here yesterday and there was a finding of a tibial vein thrombosis.  Decision was made to anticoagulate her yesterday.  Was seen today by her family doctor and due to a concern that they could not find a pulse and tripped here.  The patient does have an intact pulse to that foot.  History is not consistent with an acute arterial occlusion.  We will have her follow-up as an outpatient with vascular for further studies if needed.  She is requesting a boot so we will provide 1.  12:26 PM:  I have discussed the diagnosis/risks/treatment options with the patient and believe the pt to be eligible for discharge home to follow-up with PCP, vascular. We also discussed returning to the ED immediately if new or worsening sx occur. We discussed the sx which are most concerning (e.g., sudden worsening pain, fever, inability to tolerate by mouth, PE s/sx) that necessitate immediate return. Medications administered to the patient during their visit and any new prescriptions provided to the patient are listed below.  Medications given during this visit Medications - No data to display   The  patient appears reasonably screen and/or stabilized for discharge and I doubt any other medical condition or other [facility] requiring further screening, evaluation, or treatment in the ED at this time prior to discharge.   Final Clinical Impression(s) / ED Diagnoses Final diagnoses:  Superficial vein thrombosis  Leg pain, left    Rx / DC Orders ED Discharge Orders    None       Melene Plan, DO 03/31/20 1226

## 2020-03-31 NOTE — Discharge Instructions (Signed)
Follow up with your family doc.  Follow up with vascular.   Return for chest pain, shortness of breath, if you feel like you are going to pass out.   Return for worsening leg pain, change in color of the leg.

## 2020-04-07 ENCOUNTER — Other Ambulatory Visit: Payer: Self-pay

## 2020-04-07 DIAGNOSIS — I739 Peripheral vascular disease, unspecified: Secondary | ICD-10-CM

## 2020-04-08 ENCOUNTER — Ambulatory Visit: Payer: 59 | Admitting: Vascular Surgery

## 2020-04-08 ENCOUNTER — Other Ambulatory Visit: Payer: Self-pay

## 2020-04-08 ENCOUNTER — Ambulatory Visit (HOSPITAL_COMMUNITY)
Admission: RE | Admit: 2020-04-08 | Discharge: 2020-04-08 | Disposition: A | Discharge status: Home / Self Care | Payer: 59 | Source: Ambulatory Visit | Attending: Vascular Surgery | Admitting: Vascular Surgery

## 2020-04-08 ENCOUNTER — Encounter: Payer: Self-pay | Admitting: Vascular Surgery

## 2020-04-08 VITALS — BP 133/84 | HR 76 | Temp 98.0°F | Resp 20 | Ht 62.0 in | Wt 185.0 lb

## 2020-04-08 DIAGNOSIS — I739 Peripheral vascular disease, unspecified: Secondary | ICD-10-CM

## 2020-04-08 DIAGNOSIS — I824Z2 Acute embolism and thrombosis of unspecified deep veins of left distal lower extremity: Secondary | ICD-10-CM | POA: Diagnosis not present

## 2020-04-08 NOTE — Progress Notes (Signed)
Referring Physician: Dr Veverly Fells  Patient name: Jessica Frye MRN: [id number] DOB: January 05, 1975 Sex: female  REASON FOR CONSULT: Left leg pain rule out arterial occlusive disease  HPI: Jessica Frye is a 45 y.o. female, with a 1 month history of cramping in both lower extremities left leg worse than the right.  A week ago the left leg pain became worse.  She went to the emergency room had a DVT ultrasound which showed posterior tibial vein thrombosis.  Placed on anticoagulation.  She has currently been given a 1 month supply.  She is going to visit with her primary care physician to see whether or not to complete a 40-month course.  She has never had a prior DVT.  She has no family history of hypercoagulable state.  She does not smoke.  She is not on estrogen supplements.  She has no history of cardiac arrhythmia.  She was noted to have a cool sensation in her feet recently and possible abnormal pulse exam.  She was sent here for further evaluation.     Past Medical History:  Diagnosis Date  . DVT (deep venous thrombosis) (HCC)    Past Surgical History:  Procedure Laterality Date  . BREAST CYST EXCISION Right     Family History  Problem Relation Age of Onset  . Breast cancer Neg Hx     SOCIAL HISTORY: Social History   Socioeconomic History  . Marital status: Single    Spouse name: Not on file  . Number of children: Not on file  . Years of education: Not on file  . Highest education level: Not on file  Occupational History  . Not on file  Tobacco Use  . Smoking status: Never Smoker  . Smokeless tobacco: Never Used  Vaping Use  . Vaping Use: Never used  Substance and Sexual Activity  . Alcohol use: Not Currently  . Drug use: Not on file  . Sexual activity: Not on file  Other Topics Concern  . Not on file  Social History Narrative  . Not on file   Social Determinants of Health   Financial Resource Strain: Not on file  Food Insecurity: Not on file  Transportation Needs:  Not on file  Physical Activity: Not on file  Stress: Not on file  Social Connections: Not on file  Intimate Partner Violence: Not on file    No Known Allergies  Current Outpatient Medications  Medication Sig Dispense Refill  . RIVAROXABAN (XARELTO) VTE STARTER PACK (15 & 20 MG) Follow package directions: Take one 15mg  tablet by mouth twice a day. On day 22, switch to one 20mg  tablet once a day. Take with food. 51 each 0   No current facility-administered medications for this visit.    ROS:   General:  No weight loss, Fever, chills  HEENT: No recent headaches, no nasal bleeding, no visual changes, no sore throat  Neurologic: No dizziness, blackouts, seizures. No recent symptoms of stroke or mini- stroke. No recent episodes of slurred speech, or temporary blindness.  Cardiac: No recent episodes of chest pain/pressure, no shortness of breath at rest.  No shortness of breath with exertion.  Denies history of atrial fibrillation or irregular heartbeat  Vascular: No history of rest pain in feet.  No history of claudication.  No history of non-healing ulcer, No history of DVT   Pulmonary: No home oxygen, no productive cough, no hemoptysis,  No asthma or wheezing  Musculoskeletal:  [ ]  Arthritis, [ ]  Low back  pain,  [ ]  Joint pain  Hematologic:No history of hypercoagulable state.  No history of easy bleeding.  No history of anemia  Gastrointestinal: No hematochezia or melena,  No gastroesophageal reflux, no trouble swallowing  Urinary: [ ]  chronic Kidney disease, [ ]  on HD - [ ]  MWF or [ ]  TTHS, [ ]  Burning with urination, [ ]  Frequent urination, [ ]  Difficulty urinating;   Skin: No rashes  Psychological: No history of anxiety,  No history of depression   Physical Examination  Vitals:   04/08/20 1523  BP: 133/84  Pulse: 76  Resp: 20  Temp: 98 F (36.7 C)  SpO2: 92%  Weight: 185 lb (83.9 kg)  Height: 5\' 2"  (1.575 m)    Body mass index is 33.84 kg/m.  General:  Alert  and oriented, no acute distress HEENT: Normal Neck: No JVD Cardiac: Regular Rate and Rhythm Skin: No rash, toes pink with brisk capillary refill Extremity Pulses:  2+ radial, brachial, femoral, dorsalis pedis, posterior tibial pulses bilaterally Musculoskeletal: No deformity or edema  Neurologic: Upper and lower extremity motor 5/5 and symmetric  DATA:  Patient had bilateral ABIs performed today which were greater than 1 triphasic bilaterally.  I reviewed her recent duplex ultrasound from March 29 which showed an area of DVT and a posterior tibial vein there was no femoral or popliteal DVT.  ASSESSMENT: Posterior tibial vein DVT unknown inciting factor with no prior history.  No evidence of arterial occlusive disease.   PLAN: 1.  Would recommend full course of treatment for DVT with anticoagulation for 3 months.  2.  If patient has recurrent DVT in the future she would need hypercoagulable work-up.  3.  No evidence of arterial occlusive disease no significant arterial risk factors.  No further evaluation for this.  Patient will follow up with me on as-needed basis.   , MD [facility] Office: [phone]

## 2020-05-06 ENCOUNTER — Encounter (HOSPITAL_BASED_OUTPATIENT_CLINIC_OR_DEPARTMENT_OTHER): Payer: Self-pay

## 2020-05-06 ENCOUNTER — Emergency Department (HOSPITAL_BASED_OUTPATIENT_CLINIC_OR_DEPARTMENT_OTHER)
Admission: EM | Admit: 2020-05-06 | Discharge: 2020-05-06 | Disposition: A | Discharge status: Home / Self Care | Payer: 59 | Attending: Emergency Medicine | Admitting: Emergency Medicine

## 2020-05-06 ENCOUNTER — Other Ambulatory Visit: Payer: Self-pay

## 2020-05-06 ENCOUNTER — Emergency Department (HOSPITAL_BASED_OUTPATIENT_CLINIC_OR_DEPARTMENT_OTHER): Payer: 59

## 2020-05-06 DIAGNOSIS — M79605 Pain in left leg: Secondary | ICD-10-CM | POA: Diagnosis present

## 2020-05-06 DIAGNOSIS — I82502 Chronic embolism and thrombosis of unspecified deep veins of left lower extremity: Secondary | ICD-10-CM | POA: Insufficient documentation

## 2020-05-06 DIAGNOSIS — I825Y2 Chronic embolism and thrombosis of unspecified deep veins of left proximal lower extremity: Secondary | ICD-10-CM

## 2020-05-06 MED ORDER — OXYCODONE HCL 5 MG PO TABS: 5.0000 mg | ORAL_TABLET | Freq: Four times a day (QID) | ORAL | 0 refills | Status: DC | PRN

## 2020-05-06 NOTE — ED Provider Notes (Signed)
[facility] EMERGENCY DEPT Provider Note   CSN: [id number] Arrival date & time: 05/06/20  1936     History Chief Complaint  Patient presents with  . Leg Pain    Left    Jessica Frye is a 45 y.o. female with a history of left lower extremity unprovoked DVT approximately 1 month ago, now on Xarelto, presenting today with left leg pain.  She reports abrupt onset of cramping pain in her left lower leg and calf today that was initially significant, now 5 out of 10 in intensity.  She said it was reminiscent of when she was diagnosed with a blood clot.  She has been compliant with her Xarelto.  She denies chest pain or shortness of breath.  HPI     Past Medical History:  Diagnosis Date  . DVT (deep venous thrombosis) (HCC)     There are no problems to display for this patient.   Past Surgical History:  Procedure Laterality Date  . BREAST CYST EXCISION Right      OB History   No obstetric history on file.     Family History  Problem Relation Age of Onset  . Breast cancer Neg Hx     Social History   Tobacco Use  . Smoking status: Never Smoker  . Smokeless tobacco: Never Used  Vaping Use  . Vaping Use: Never used  Substance Use Topics  . Alcohol use: Not Currently  . Drug use: Never    Home Medications Prior to Admission medications   Medication Sig Start Date End Date Taking? Authorizing Provider  oxyCODONE (ROXICODONE) 5 MG immediate release tablet Take 1 tablet (5 mg total) by mouth every 6 (six) hours as needed for up to 10 doses for breakthrough pain. 05/06/20  Yes Chardonay Scritchfield, Kermit Balo, MD  RIVAROXABAN Carlena Hurl) VTE STARTER PACK (15 & 20 MG) Follow package directions: Take one 15mg  tablet by mouth twice a day. On day 22, switch to one 20mg  tablet once a day. Take with food. 03/30/20   , MD    Allergies    Patient has no known allergies.  Review of Systems   Review of Systems  Constitutional: Negative for chills and fever.   Respiratory: Negative for cough and shortness of breath.   Cardiovascular: Negative for chest pain and palpitations.  Musculoskeletal: Positive for myalgias. Negative for back pain.  Skin: Negative for color change and rash.  Neurological: Negative for syncope and light-headedness.  All other systems reviewed and are negative.   Physical Exam Updated Vital Signs BP 139/82 (BP Location: Left Arm)   Pulse 80   Temp 98.5 F (36.9 C) (Oral)   Resp 16   Ht 5\' 2"  (1.575 m)   Wt 79.4 kg   SpO2 100%   BMI 32.01 kg/m   Physical Exam Constitutional:      General: She is not in acute distress. HENT:     Head: Normocephalic and atraumatic.  Eyes:     Conjunctiva/sclera: Conjunctivae normal.     Pupils: Pupils are equal, round, and reactive to light.  Cardiovascular:     Rate and Rhythm: Normal rate and regular rhythm.  Pulmonary:     Effort: Pulmonary effort is normal. No respiratory distress.  Musculoskeletal:        General: No swelling, tenderness, deformity or signs of injury.     Right lower leg: No edema.     Left lower leg: No edema.  Skin:    General: Skin  is warm and dry.  Neurological:     General: No focal deficit present.     Mental Status: She is alert. Mental status is at baseline.  Psychiatric:        Mood and Affect: Mood normal.        Behavior: Behavior normal.     ED Results / Procedures / Treatments   Labs (all labs ordered are listed, but only abnormal results are displayed) Labs Reviewed - No data to display  EKG None  Radiology US Venous Img Lower Unilateral Left  Result Date: 05/06/2020 CLINICAL DATA:  Left lower extremity pain with history of DVT. EXAM: LEFT LOWER EXTREMITY VENOUS DOPPLER ULTRASOUND TECHNIQUE: Gray-scale sonography with compression, as well as color and duplex ultrasound, were performed to evaluate the deep venous system(s) from the level of the common femoral vein through the popliteal and proximal calf veins. COMPARISON:   March 30, 2020 FINDINGS: VENOUS Normal compressibility of the LEFT common femoral, superficial femoral, and popliteal veins, as well as the visualized LEFT peroneal vein. Abnormal compressibility is seen within a mid to upper LEFT posterior tibial vein. This is present on the prior study. Visualized portions of the LEFT profunda femoral vein and LEFT great saphenous vein unremarkable. No filling defects to suggest DVT on grayscale or color Doppler imaging. Doppler waveforms show normal direction of venous flow, normal respiratory plasticity and response to augmentation. Limited views of the contralateral common femoral vein are unremarkable. OTHER None. Limitations: none IMPRESSION: Findings consistent with persistent thrombus within the LEFT posterior tibial vein. Electronically Signed   By: Aram Candela M.D.   On: 05/06/2020 22:19    Procedures Procedures   Medications Ordered in ED Medications - No data to display  ED Course  I have reviewed the triage vital signs and the nursing notes.  Pertinent labs & imaging results that were available during my care of the patient were reviewed by me and considered in my medical decision making (see chart for details).  Left calf pain - ddx includes new, persistent or recurring DVT No evidence of cellulitis or infection Pulses present - doubt acute ischemia to foot  DVT U/S obtained showing persistent left tibial DVT. Advised continuing xarelto, f/u with PCP as noted below  No signs or symptoms of PE at this time.  Clinical Course as of 05/07/20 0025  Thu May 06, 2020  2227 IMPRESSION: Findings consistent with persistent thrombus within the LEFT posterior tibial vein. [MT]  2257 Discussed the ultrasound findings with the patient.  She appears to have a persistent, unchanged clot from 1 month ago.  I would not say this is failure of therapy given it is only been 1 month.  Advised that she discuss the duration of her treatment with her PCP and  consider repeat ultrasound at the 2-month or later mark prior to discontinuing Xarelto. 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

## 2020-05-06 NOTE — ED Triage Notes (Signed)
Patient here POV from Home with Left Leg Pain.  Patient has been on Xarelto since testing Positive for a Blood Clot in Left Leg approx. 1 month PTA.  No CP, No SOB, Patient Ambulatory but Painful.  No Recent Trauma.

## 2020-05-06 NOTE — Discharge Instructions (Addendum)
Your ultrasound today showed an unchanged blood clot in your left lower leg.  I would advise that you call and follow-up with your primary care doctor on this issue.  He should discuss the duration of your Xarelto treatment with your doctor.  It is not unusual to need to be on blood thinners for 3 to 6 months, or even longer, if your clots are not improving or getting worse.  Your doctor may want to schedule another ultrasound several months from now to see if you are improving.  Keep your leg elevated at home.  You can use heating packs to help try to improve the blood flow to your leg.

## 2020-08-05 ENCOUNTER — Other Ambulatory Visit: Payer: Self-pay | Admitting: Obstetrics and Gynecology

## 2020-08-05 DIAGNOSIS — Z1231 Encounter for screening mammogram for malignant neoplasm of breast: Secondary | ICD-10-CM

## 2020-08-26 ENCOUNTER — Other Ambulatory Visit: Payer: Self-pay

## 2020-08-26 ENCOUNTER — Inpatient Hospital Stay: Payer: 59 | Attending: Oncology | Admitting: Oncology

## 2020-08-26 VITALS — BP 144/73 | HR 70 | Temp 99.1°F | Resp 17 | Wt 186.4 lb

## 2020-08-26 DIAGNOSIS — Z7901 Long term (current) use of anticoagulants: Secondary | ICD-10-CM | POA: Insufficient documentation

## 2020-08-26 DIAGNOSIS — I82442 Acute embolism and thrombosis of left tibial vein: Secondary | ICD-10-CM | POA: Insufficient documentation

## 2020-08-26 DIAGNOSIS — I82422 Acute embolism and thrombosis of left iliac vein: Secondary | ICD-10-CM | POA: Diagnosis present

## 2020-08-26 DIAGNOSIS — R768 Other specified abnormal immunological findings in serum: Secondary | ICD-10-CM | POA: Diagnosis not present

## 2020-08-26 DIAGNOSIS — I82432 Acute embolism and thrombosis of left popliteal vein: Secondary | ICD-10-CM

## 2020-08-26 NOTE — Progress Notes (Signed)
Reason for the request:    Deep vein thrombosis  HPI: I was asked by Dr. Docia Chuck  to evaluate Jessica Frye of the evaluation of deep vein thrombosis.  She is a 45 year old woman without any significant comorbid conditions presented to the emergency department in March 2022 with bilateral leg cramps with the pain in the left more than the right.  Ultrasound Doppler noted at that time to have posterior tibial pain thrombosis.  She was started on Xarelto at that time and repeat Doppler obtained on May 5 showed persistent thrombus within the left posterior tibial vein.  She remained on Xarelto and had a partial hypercoagulable panel obtained in August 2022.  Which showed activated to have protein resistant ratio to be normal with slight decline in the protein as functional activity down to 30%.  Otherwise the protein C functionality was normal.  Antithrombin III activity was normal with no lupus anticoagulant detected.  Her IgM anticardiolipin antibody elevated at 16.    Clinically, she reports feeling well and reports no pain in her legs at this time.  She has had completed more than 3 months of anticoagulation at this time.  She denies any personal or family history of thrombophilia.  She did have 1 successful pregnancy to term without any complications.  He had a partial hysterectomy and breast surgery in the past without any postoperative complications or thrombosis.  She does not report any headaches, blurry vision, syncope or seizures. Does not report any fevers, chills or sweats.  Does not report any cough, wheezing or hemoptysis.  Does not report any chest pain, palpitation, orthopnea or leg edema.  Does not report any nausea, vomiting or abdominal pain.  Does not report any constipation or diarrhea.  Does not report any skeletal complaints.    Does not report frequency, urgency or hematuria.  Does not report any skin rashes or lesions. Does not report any heat or cold intolerance.  Does not report any  lymphadenopathy or petechiae.  Does not report any anxiety or depression.  Remaining review of systems is negative.     Past Medical History:  Diagnosis Date   DVT (deep venous thrombosis) (HCC)   :   Past Surgical History:  Procedure Laterality Date   BREAST CYST EXCISION Right   :   Current Outpatient Medications:    oxyCODONE (ROXICODONE) 5 MG immediate release tablet, Take 1 tablet (5 mg total) by mouth every 6 (six) hours as needed for up to 10 doses for breakthrough pain., Disp: 10 tablet, Rfl: 0   RIVAROXABAN (XARELTO) VTE STARTER PACK (15 & 20 MG), Follow package directions: Take one 15mg  tablet by mouth twice a day. On day 22, switch to one 20mg  tablet once a day. Take with food., Disp: 51 each, Rfl: 0:  No Known Allergies:   Family History  Problem Relation Age of Onset   Breast cancer Neg Hx   :   Social History   Socioeconomic History   Marital status: Single    Spouse name: Not on file   Number of children: Not on file   Years of education: Not on file   Highest education level: Not on file  Occupational History   Not on file  Tobacco Use   Smoking status: Never   Smokeless tobacco: Never  Vaping Use   Vaping Use: Never used  Substance and Sexual Activity   Alcohol use: Not Currently   Drug use: Never   Sexual activity: Not on file  Other Topics Concern   Not on file  Social History Narrative   Not on file   Social Determinants of Health   Financial Resource Strain: Not on file  Food Insecurity: Not on file  Transportation Needs: Not on file  Physical Activity: Not on file  Stress: Not on file  Social Connections: Not on file  Intimate Partner Violence: Not on file  :  Pertinent items are noted in HPI.  Exam: Blood pressure (!) 144/73, pulse 70, temperature 99.1 F (37.3 C), temperature source Oral, resp. rate 17, weight 186 lb 6.4 oz (84.6 kg), SpO2 98 %. ECOG 0 General appearance: alert and cooperative appeared without  distress. Head: atraumatic without any abnormalities. Eyes: conjunctivae/corneas clear. PERRL.  Sclera anicteric. Throat: lips, mucosa, and tongue normal; without oral thrush or ulcers. Resp: clear to auscultation bilaterally without rhonchi, wheezes or dullness to percussion. Cardio: regular rate and rhythm, S1, S2 normal, no murmur, click, rub or gallop GI: soft, non-tender; bowel sounds normal; no masses,  no organomegaly Skin: Skin color, texture, turgor normal. No rashes or lesions Lymph nodes: Cervical, supraclavicular, and axillary nodes normal. Neurologic: Grossly normal without any motor, sensory or deep tendon reflexes. Musculoskeletal: No joint deformity or effusion.    Assessment and Plan:    45 year old woman with:  1.  Left lower extremity deep vein thrombosis involving the popliteal vein diagnosed in March 2022.  It is unclear if she has any provoking factors leading up to it.  Long travel to [place] could have contributed although she reports the timing might not fit.  The natural course of her disease and risk of recurrent thrombosis and duration of anticoagulation was discussed today in detail.  Given the fact that she has uncomplicated deep vein thrombosis with potentially provoked factor I recommended that duration of anticoagulation to be 3 months then discontinue at this time.  She has completed more than 3 months at this time and does not require any additional anticoagulation.  She understands if she develops future thrombosis in the future longer anticoagulation may be required.  Inherited and acquired thrombophilia possibilities were discussed.  Her partial hypercoagulable panel was discussed and a decreased protein S activity remains unclear at this time.  This could be false positive result related to her recent thrombosis and anticoagulation.  From a management standpoint, recommended her to be off anticoagulation and to update her hypercoagulable panel off  treatment in the next few months.  Based on these results further recommendation will be given.  2.  Follow-up:  will be in the next 4 to 5 months after repeating hypercoagulable panel.   45  minutes were dedicated to this visit. The time was spent on reviewing laboratory data,  discussing treatment options, discussing differential diagnosis and answering questions regarding future plan.     A copy of this consult has been forwarded to the requesting physician.

## 2020-09-13 DIAGNOSIS — Z0289 Encounter for other administrative examinations: Secondary | ICD-10-CM

## 2020-09-22 ENCOUNTER — Ambulatory Visit (INDEPENDENT_AMBULATORY_CARE_PROVIDER_SITE_OTHER): Payer: 59 | Admitting: Family Medicine

## 2020-09-22 ENCOUNTER — Other Ambulatory Visit: Payer: Self-pay

## 2020-09-22 ENCOUNTER — Encounter (INDEPENDENT_AMBULATORY_CARE_PROVIDER_SITE_OTHER): Payer: Self-pay | Admitting: Family Medicine

## 2020-09-22 VITALS — BP 111/75 | HR 68 | Temp 98.2°F | Ht 62.0 in | Wt 186.0 lb

## 2020-09-22 DIAGNOSIS — R5383 Other fatigue: Secondary | ICD-10-CM | POA: Diagnosis not present

## 2020-09-22 DIAGNOSIS — Z6834 Body mass index (BMI) 34.0-34.9, adult: Secondary | ICD-10-CM

## 2020-09-22 DIAGNOSIS — R0602 Shortness of breath: Secondary | ICD-10-CM | POA: Diagnosis not present

## 2020-09-22 DIAGNOSIS — Z9189 Other specified personal risk factors, not elsewhere classified: Secondary | ICD-10-CM | POA: Diagnosis not present

## 2020-09-22 DIAGNOSIS — E7849 Other hyperlipidemia: Secondary | ICD-10-CM | POA: Diagnosis not present

## 2020-09-22 DIAGNOSIS — E559 Vitamin D deficiency, unspecified: Secondary | ICD-10-CM

## 2020-09-22 DIAGNOSIS — Z1331 Encounter for screening for depression: Secondary | ICD-10-CM

## 2020-09-22 DIAGNOSIS — E669 Obesity, unspecified: Secondary | ICD-10-CM

## 2020-09-22 NOTE — Progress Notes (Signed)
Dear Dr. Docia Chuck,   Thank you for referring Jessica Frye to our clinic. The following note includes my evaluation and treatment recommendations.  Chief Complaint:   OBESITY Jessica Frye (MR# [id number]) is a 45 y.o. female who presents for evaluation and treatment of obesity and related comorbidities. Current BMI is Body mass index is 34.02 kg/m. Jessica Frye has been struggling with her weight for many years and has been unsuccessful in either losing weight, maintaining weight loss, or reaching her healthy weight goal.  Jessica Frye is currently in the action stage of change and ready to dedicate time achieving and maintaining a healthier weight. Jessica Frye is interested in becoming our patient and working on intensive lifestyle modifications including (but not limited to) diet and exercise for weight loss.  Referred by Dr. Docia Chuck. 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 She is skipping breakfast or lunch daily. 9-9:30 AM- Jessica Frye Bowl (400 cal + 26 gram protein) or omelet with cheese (prepared) or [employer] breakfast sandwich with potato; Coffee with 1 creamer + 3 tsp sugar; May drink soda with breakfast; Skips lunch; Dinner- spaghetti or steak (4-5 oz), beans (1 cup), salad (1 cup); After dinner- may have cucumber + vinegar.  Jessica Frye habits were reviewed today and are as follows: Her family eats meals together, she thinks her family will eat healthier with her, her desired weight loss is 31 lbs, she started gaining weight over the last couple of years, her heaviest weight ever was 188 pounds, she has significant food cravings issues, she snacks frequently in the evenings, she skips meals frequently, she is frequently drinking liquids with calories, she frequently makes poor food choices, she frequently eats larger portions than normal, and she struggles with emotional eating.  Depression Screen Jessica Frye Food and Mood (modified PHQ-9) score was 10.  Depression  screen [facility] 2/9 09/22/2020  Decreased Interest 2  Down, Depressed, Hopeless 1  PHQ - 2 Score 3  Altered sleeping 2  Tired, decreased energy 3  Change in appetite 1  Feeling bad or failure about yourself  1  Trouble concentrating 0  Moving slowly or fidgety/restless 0  Suicidal thoughts 0  PHQ-9 Score 10  Difficult doing work/chores Not difficult at all   Subjective:   1. Other fatigue Jessica Frye admits to daytime somnolence and admits to waking up still tired. Patent has a history of symptoms of daytime fatigue, morning fatigue, and morning headache. Jessica Frye generally gets 7 hours of sleep per night, and states that she has generally restful sleep. Snoring is present. Apneic episodes are not present. Epworth Sleepiness Score is 4. EKG normal sinus rhythm at 82 bpm.  2. SOB (shortness of breath) Jessica Frye notes increasing shortness of breath with exercising and seems to be worsening over time with weight gain. She notes getting out of breath sooner with activity than she used to. This has gotten worse recently. Jessica Frye denies shortness of breath at rest or orthopnea.  3. Vitamin D deficiency Pt denies nausea, vomiting, and muscle weakness but notes fatigue. She is on prescription Vit D weekly.  4. Other hyperlipidemia Jessica Frye last LDL was 118, triglycerides 56, HDL 59, and total cholesterol 188.  5. At risk for osteoporosis Jessica Frye is at higher risk of osteopenia and osteoporosis due to Vitamin D deficiency.   Assessment/Plan:   1. Other fatigue Jessica Frye does feel that her weight is causing her energy to be lower than it should be. Fatigue may be related to obesity, depression or many  other causes. Labs will be ordered, and in the meanwhile, Jessica Frye will focus on self care including making healthy food choices, increasing physical activity and focusing on stress reduction. Check labs today.  - EKG 12-Lead - Vitamin B12 - Folate - Insulin, random - T3 - T4, free  2. SOB (shortness of  breath) Jessica Frye does feel that she gets out of breath more easily that she used to when she exercises. Jessica Frye shortness of breath appears to be obesity related and exercise induced. She has agreed to work on weight loss and gradually increase exercise to treat her exercise induced shortness of breath. Will continue to monitor closely. Check labs today.  - CBC with Differential/Platelet  3. Vitamin D deficiency Low Vitamin D level contributes to fatigue and are associated with obesity, breast, and colon cancer. She agrees to continue to take prescription Vitamin D 50,000 IU every week and will follow-up for routine testing of Vitamin D, at least 2-3 times per year to avoid over-replacement. Check labs today.  - VITAMIN D 25 Hydroxy (Vit-D Deficiency, Fractures)  4. Other hyperlipidemia Cardiovascular risk and specific lipid/LDL goals reviewed.  We discussed several lifestyle modifications today and Jessica Frye will continue to work on diet, exercise and weight loss efforts. Orders and follow up as documented in patient record. Repeat labs in early 2023.  Counseling Intensive lifestyle modifications are the first line treatment for this issue. Dietary changes: Increase soluble fiber. Decrease simple carbohydrates. Exercise changes: Moderate to vigorous-intensity aerobic activity 150 minutes per week if tolerated. Lipid-lowering medications: see documented in medical record.  5. Depression screening Jessica Frye had a positive depression screening. Depression is commonly associated with obesity and often results in emotional eating behaviors. We will monitor this closely and work on CBT to help improve the non-hunger eating patterns. Referral to Psychology may be required if no improvement is seen as she continues in our clinic.  6. At risk for osteoporosis Jessica Frye was given approximately 15 minutes of osteoporosis prevention counseling today. Jessica Frye is at risk for osteopenia and osteoporosis due to her  Vitamin D deficiency. She was encouraged to take her Vitamin D and follow her higher calcium diet and increase strengthening exercise to help strengthen her bones and decrease her risk of osteopenia and osteoporosis.  Repetitive spaced learning was employed today to elicit superior memory formation and behavioral change.  7. Obesity with current BMI of 34.1  Jessica Frye is currently in the action stage of change and her goal is to continue with weight loss efforts. I recommend Jessica Frye begin the structured treatment plan as follows:  She has agreed to the Category 2 Plan.  Exercise goals: No exercise has been prescribed at this time.   Behavioral modification strategies: increasing lean protein intake, no skipping meals, and meal planning and cooking strategies.  She was informed of the importance of frequent follow-up visits to maximize her success with intensive lifestyle modifications for her multiple health conditions. She was informed we would discuss her lab results at her next visit unless there is a critical issue that needs to be addressed sooner. Jessica Frye agreed to keep her next visit at the agreed upon time to discuss these results.  Objective:   Blood pressure 111/75, pulse 68, temperature 98.2 F (36.8 C), height 5\' 2"  (1.575 m), weight 186 lb (84.4 kg), SpO2 97 %. Body mass index is 34.02 kg/m.  EKG: Normal sinus rhythm, rate 82.  Indirect Calorimeter completed today shows a VO2 of 229 and a REE of 1584.  Her calculated basal metabolic rate is 6962 thus her basal metabolic rate is better than expected.  General: Cooperative, alert, well developed, in no acute distress. HEENT: Conjunctivae and lids unremarkable. Cardiovascular: Regular rhythm.  Lungs: Normal work of breathing. Neurologic: No focal deficits.   Lab Results  Component Value Date   CREATININE 0.76 03/30/2020   BUN 11 03/30/2020   NA 137 03/30/2020   K 3.8 03/30/2020   CL 103 03/30/2020   CO2 27 03/30/2020    No results found for: ALT, AST, GGT, ALKPHOS, BILITOT No results found for: HGBA1C No results found for: INSULIN No results found for: TSH No results found for: CHOL, HDL, LDLCALC, LDLDIRECT, TRIG, CHOLHDL Lab Results  Component Value Date   WBC 4.8 03/30/2020   HGB 13.1 03/30/2020   HCT 39.9 03/30/2020   MCV 87.1 03/30/2020   PLT 323 03/30/2020    Attestation Statements:   Reviewed by clinician on day of visit: allergies, medications, problem list, medical history, surgical history, family history, social history, and previous encounter notes.  Jessica Frye, CMA, am acting as transcriptionist for Reuben Likes, MD.  This is the patient's first visit at [facility]. The patient's NEW PATIENT PACKET was reviewed at length. Included in the packet: current and past health history, medications, allergies, ROS, gynecologic history (women only), surgical history, family history, social history, weight history, weight loss surgery history (for those that have had weight loss surgery), nutritional evaluation, mood and food questionnaire, PHQ9, Epworth questionnaire, sleep habits questionnaire, patient life and health improvement goals questionnaire. These will all be scanned into the patient's chart under media.   During the visit, I independently reviewed the patient's EKG, bioimpedance scale results, and indirect calorimeter results. I used this information to tailor a meal plan for the patient that will help her to lose weight and will improve her obesity-related conditions going forward. I performed a medically necessary appropriate examination and/or evaluation. I discussed the assessment and treatment plan with the patient. The patient was provided an opportunity to ask questions and all were answered. The patient agreed with the plan and demonstrated an understanding of the instructions. Labs were ordered at this visit and will be reviewed at the next visit unless  more critical results need to be addressed immediately. Clinical information was updated and documented in the EMR.   Time spent on visit including pre-visit chart review and post-visit care was 45 minutes.   A separate 15 minutes was spent on risk counseling (see above).   I have reviewed the above documentation for accuracy and completeness, and I agree with the above. - Reuben Likes, MD

## 2020-09-23 LAB — CBC WITH DIFFERENTIAL/PLATELET
Basophils Absolute: 0 10*3/uL (ref 0.0–0.2)
Basos: 1 %
EOS (ABSOLUTE): 0.1 10*3/uL (ref 0.0–0.4)
Eos: 2 %
Hematocrit: 41.8 % (ref 34.0–46.6)
Hemoglobin: 13.2 g/dL (ref 11.1–15.9)
Immature Grans (Abs): 0 10*3/uL (ref 0.0–0.1)
Immature Granulocytes: 0 %
Lymphocytes Absolute: 1.6 10*3/uL (ref 0.7–3.1)
Lymphs: 36 %
MCH: 28 pg (ref 26.6–33.0)
MCHC: 31.6 g/dL (ref 31.5–35.7)
MCV: 89 fL (ref 79–97)
Monocytes Absolute: 0.4 10*3/uL (ref 0.1–0.9)
Monocytes: 8 %
Neutrophils Absolute: 2.4 10*3/uL (ref 1.4–7.0)
Neutrophils: 53 %
Platelets: 328 10*3/uL (ref 150–450)
RBC: 4.72 x10E6/uL (ref 3.77–5.28)
RDW: 12.2 % (ref 11.7–15.4)
WBC: 4.5 10*3/uL (ref 3.4–10.8)

## 2020-09-23 LAB — VITAMIN B12: Vitamin B-12: 436 pg/mL (ref 232–1245)

## 2020-09-23 LAB — FOLATE: Folate: 13.4 ng/mL (ref >3.0)

## 2020-09-23 LAB — T3: T3, Total: 109 ng/dL (ref 71–180)

## 2020-09-23 LAB — T4, FREE: Free T4: 1.23 ng/dL (ref 0.82–1.77)

## 2020-09-23 LAB — VITAMIN D 25 HYDROXY (VIT D DEFICIENCY, FRACTURES): Vit D, 25-Hydroxy: 58.3 ng/mL (ref 30.0–100.0)

## 2020-09-23 LAB — INSULIN, RANDOM: INSULIN: 14.9 u[IU]/mL (ref 2.6–24.9)

## 2020-09-27 ENCOUNTER — Other Ambulatory Visit: Payer: Self-pay

## 2020-09-27 ENCOUNTER — Ambulatory Visit
Admission: RE | Admit: 2020-09-27 | Discharge: 2020-09-27 | Disposition: A | Discharge status: Home / Self Care | Payer: 59 | Source: Ambulatory Visit | Attending: Obstetrics and Gynecology | Admitting: Obstetrics and Gynecology

## 2020-09-27 DIAGNOSIS — Z1231 Encounter for screening mammogram for malignant neoplasm of breast: Secondary | ICD-10-CM

## 2020-10-06 ENCOUNTER — Telehealth (INDEPENDENT_AMBULATORY_CARE_PROVIDER_SITE_OTHER): Payer: Self-pay

## 2020-10-06 ENCOUNTER — Encounter (INDEPENDENT_AMBULATORY_CARE_PROVIDER_SITE_OTHER): Payer: Self-pay | Admitting: Family Medicine

## 2020-10-06 ENCOUNTER — Ambulatory Visit (INDEPENDENT_AMBULATORY_CARE_PROVIDER_SITE_OTHER): Payer: 59 | Admitting: Family Medicine

## 2020-10-06 ENCOUNTER — Other Ambulatory Visit: Payer: Self-pay

## 2020-10-06 VITALS — BP 107/70 | HR 73 | Temp 98.0°F | Ht 62.0 in | Wt 186.0 lb

## 2020-10-06 DIAGNOSIS — E669 Obesity, unspecified: Secondary | ICD-10-CM

## 2020-10-06 DIAGNOSIS — Z9189 Other specified personal risk factors, not elsewhere classified: Secondary | ICD-10-CM

## 2020-10-06 DIAGNOSIS — E7849 Other hyperlipidemia: Secondary | ICD-10-CM | POA: Diagnosis not present

## 2020-10-06 DIAGNOSIS — E559 Vitamin D deficiency, unspecified: Secondary | ICD-10-CM

## 2020-10-06 DIAGNOSIS — E8881 Metabolic syndrome: Secondary | ICD-10-CM | POA: Diagnosis not present

## 2020-10-06 DIAGNOSIS — Z6834 Body mass index (BMI) 34.0-34.9, adult: Secondary | ICD-10-CM

## 2020-10-06 MED ORDER — TIRZEPATIDE 2.5 MG/0.5ML ~~LOC~~ SOAJ: 2.5000 mg | SUBCUTANEOUS | 0 refills | Status: DC

## 2020-10-06 NOTE — Progress Notes (Signed)
Chief Complaint:   OBESITY Jessica Frye is here to discuss her progress with her obesity treatment plan along with follow-up of her obesity related diagnoses. Jessica Frye is on the Category 2 Plan and states she is following her eating plan approximately 70% of the time. Jessica Frye states she is not currently exercising.  Today's visit was #: 2 Starting weight: 186 lbs Starting date: 09/22/2020 Today's weight: 186 lbs Today's date: 10/06/2020 Total lbs lost to date: 0 Total lbs lost since last in-office visit: 0  Interim History: Jessica Frye feels she needs to get to a better scheduled time to get all food in. She started buying some granola to snack on and liked the food for each meal. She did boiled eggs for breakfast and lunch was mostly a sandwich with 4 oz protein. Dinner was steak or chicken (6-8 oz) and vegetables (felt full).  Subjective:   1. Vitamin D deficiency Jessica Frye is taking prescription Vit D . Her Vit D level is 58.3.  2. Insulin resistance Her A1c was 5.3 on 09/02/2020 and insulin level 14.9. She is not on medication.  3. Other hyperlipidemia Pt's LDL is 118, HDL 59, triglycerides 56, and total 188. She is not on statin therapy.  4. At risk for side effect of medication Jessica Frye is at risk for side effects of medication due to starting [facility].  Assessment/Plan:   1. Vitamin D deficiency Low Vitamin D level contributes to fatigue and are associated with obesity, breast, and colon cancer. She will change from prescription Vitamin D to OTC Vit D and will follow-up for routine testing of Vitamin D, at least 2-3 times per year to avoid over-replacement.  2. Insulin resistance Jessica Frye will start Jessica Frye 2.5 mg and continue to work on weight loss, exercise, and decreasing simple carbohydrates to help decrease the risk of diabetes. Jessica Frye agreed to follow-up with [place] as directed to closely monitor her progress. Repeat labs in 3 months.   Start- tirzepatide [facility]) 2.5 MG/0.5ML Pen;  Inject 2.5 mg into the skin once a week.  Dispense: 2 mL; Refill: 0  3. Other hyperlipidemia Cardiovascular risk and specific lipid/LDL goals reviewed.  We discussed several lifestyle modifications today and Jessica Frye will continue to work on diet, exercise and weight loss efforts. Orders and follow up as documented in patient record. Repeat labs in 3 months.  Counseling Intensive lifestyle modifications are the first line treatment for this issue. Dietary changes: Increase soluble fiber. Decrease simple carbohydrates. Exercise changes: Moderate to vigorous-intensity aerobic activity 150 minutes per week if tolerated. Lipid-lowering medications: see documented in medical record.  4. At risk for side effect of medication Jessica Frye was given approximately 30 minutes of drug side effect counseling today.  We discussed side effect possibility and risk versus benefits. Jessica Frye agreed to the medication and will contact this office if these side effects are intolerable.  Repetitive spaced learning was employed today to elicit superior memory formation and behavioral change.   5. Obesity with current BMI of 34.0  Jessica Frye is currently in the action stage of change. As such, her goal is to continue with weight loss efforts. She has agreed to the Category 2 Plan.   Exercise goals: All adults should avoid inactivity. Some physical activity is better than none, and adults who participate in any amount of physical activity gain some health benefits.  Behavioral modification strategies: increasing lean protein intake, meal planning and cooking strategies, and keeping healthy foods in the home.  Jessica Frye has agreed to follow-up with  our clinic in 2 weeks. She was informed of the importance of frequent follow-up visits to maximize her success with intensive lifestyle modifications for her multiple health conditions.   Objective:   Blood pressure 107/70, pulse 73, temperature 98 F (36.7 C), height 5\' 2"  (1.575 m),  weight 186 lb (84.4 kg), SpO2 98 %. Body mass index is 34.02 kg/m.  General: Cooperative, alert, well developed, in no acute distress. HEENT: Conjunctivae and lids unremarkable. Cardiovascular: Regular rhythm.  Lungs: Normal work of breathing. Neurologic: No focal deficits.   Lab Results  Component Value Date   CREATININE 0.76 03/30/2020   BUN 11 03/30/2020   NA 137 03/30/2020   K 3.8 03/30/2020   CL 103 03/30/2020   CO2 27 03/30/2020   No results found for: ALT, AST, GGT, ALKPHOS, BILITOT No results found for: HGBA1C Lab Results  Component Value Date   INSULIN 14.9 09/22/2020   No results found for: TSH No results found for: CHOL, HDL, LDLCALC, LDLDIRECT, TRIG, CHOLHDL Lab Results  Component Value Date   VD25OH 58.3 09/22/2020   Lab Results  Component Value Date   WBC 4.5 09/22/2020   HGB 13.2 09/22/2020   HCT 41.8 09/22/2020   MCV 89 09/22/2020   PLT 328 09/22/2020    Attestation Statements:   Reviewed by clinician on day of visit: allergies, medications, problem list, medical history, surgical history, family history, social history, and previous encounter notes.  09/24/2020, CMA, am acting as transcriptionist for Edmund Hilda, MD.  I have reviewed the above documentation for accuracy and completeness, and I agree with the above. - Reuben Likes, MD

## 2020-10-06 NOTE — Telephone Encounter (Signed)
Prior authorization has been started for Mounjaro. Will notify patient and provider once response is received.  

## 2020-10-07 ENCOUNTER — Encounter (INDEPENDENT_AMBULATORY_CARE_PROVIDER_SITE_OTHER): Payer: Self-pay

## 2020-10-27 ENCOUNTER — Other Ambulatory Visit: Payer: Self-pay

## 2020-10-27 ENCOUNTER — Encounter (INDEPENDENT_AMBULATORY_CARE_PROVIDER_SITE_OTHER): Payer: Self-pay | Admitting: Family Medicine

## 2020-10-27 ENCOUNTER — Ambulatory Visit (INDEPENDENT_AMBULATORY_CARE_PROVIDER_SITE_OTHER): Payer: 59 | Admitting: Family Medicine

## 2020-10-27 VITALS — BP 110/65 | HR 73 | Temp 98.2°F | Ht 62.0 in | Wt 180.0 lb

## 2020-10-27 DIAGNOSIS — E669 Obesity, unspecified: Secondary | ICD-10-CM | POA: Diagnosis not present

## 2020-10-27 DIAGNOSIS — E559 Vitamin D deficiency, unspecified: Secondary | ICD-10-CM

## 2020-10-27 DIAGNOSIS — E8881 Metabolic syndrome: Secondary | ICD-10-CM

## 2020-10-27 DIAGNOSIS — Z6834 Body mass index (BMI) 34.0-34.9, adult: Secondary | ICD-10-CM

## 2020-10-27 MED ORDER — TIRZEPATIDE 5 MG/0.5ML ~~LOC~~ SOAJ: 5.0000 mg | SUBCUTANEOUS | 0 refills | Status: DC

## 2020-10-27 NOTE — Progress Notes (Signed)
Chief Complaint:   OBESITY Jessica Frye is here to discuss her progress with her obesity treatment plan along with follow-up of her obesity related diagnoses. Jessica Frye is on the Category 2 Plan and states she is following her eating plan approximately 60-70% of the time. Jessica Frye states she is doing cardio and weight training 40 minutes 3 times per week.  Today's visit was #: 3 Starting weight: 186 lbs Starting date: 09/22/2020 Today's weight: 180 lbs Today's date: 10/27/2020 Total lbs lost to date: 6 Total lbs lost since last in-office visit: 6  Interim History: Jessica Frye has been doing well on category 2 and Mounjaro. She is noticing a significant decline in cravings and change of taste for certain things. She started exercising 3 times a week and is feeling good. Pt was sick last week with laryngitis and sinus infection. She wants to focus on increasing protein intake and set herself on more of a schedule.  Subjective:   1. Insulin resistance Jessica Frye started [facility] with good initial control of food intake. She denies GI side effects.  2. Vitamin D deficiency Jessica Frye is doing well on OTC Vit D.   Assessment/Plan:   1. Insulin resistance Jessica Frye will increase Mounjaro to 5 mg as directed. She will also continue to work on weight loss, exercise, and decreasing simple carbohydrates to help decrease the risk of diabetes. Jessica Frye agreed to follow-up with [place] as directed to closely monitor her progress.  Increase and Refill- tirzepatide (MOUNJARO) 5 MG/0.5ML Pen; Inject 5 mg into the skin once a week.  Dispense: 6 mL; Refill: 0  2. Vitamin D deficiency Low Vitamin D level contributes to fatigue and are associated with obesity, breast, and colon cancer. She agrees to OTC to take prescription Vitamin D daily and will follow-up for routine testing of Vitamin D, at least 2-3 times per year to avoid over-replacement.  3. Obesity with current BMI of 33.0  Jessica Frye is currently in the action stage of  change. As such, her goal is to continue with weight loss efforts. She has agreed to the Category 2 Plan.   Exercise goals:  As is  Behavioral modification strategies: increasing lean protein intake and meal planning and cooking strategies.  Kang has agreed to follow-up with our clinic in 2 weeks. She was informed of the importance of frequent follow-up visits to maximize her success with intensive lifestyle modifications for her multiple health conditions.   Objective:   Blood pressure 110/65, pulse 73, temperature 98.2 F (36.8 C), height 5\' 2"  (1.575 m), weight 180 lb (81.6 kg), SpO2 97 %. Body mass index is 32.92 kg/m.  General: Cooperative, alert, well developed, in no acute distress. HEENT: Conjunctivae and lids unremarkable. Cardiovascular: Regular rhythm.  Lungs: Normal work of breathing. Neurologic: No focal deficits.   Lab Results  Component Value Date   CREATININE 0.76 03/30/2020   BUN 11 03/30/2020   NA 137 03/30/2020   K 3.8 03/30/2020   CL 103 03/30/2020   CO2 27 03/30/2020   No results found for: ALT, AST, GGT, ALKPHOS, BILITOT No results found for: HGBA1C Lab Results  Component Value Date   INSULIN 14.9 09/22/2020   No results found for: TSH No results found for: CHOL, HDL, LDLCALC, LDLDIRECT, TRIG, CHOLHDL Lab Results  Component Value Date   VD25OH 58.3 09/22/2020   Lab Results  Component Value Date   WBC 4.5 09/22/2020   HGB 13.2 09/22/2020   HCT 41.8 09/22/2020   MCV 89 09/22/2020  PLT 328 09/22/2020    Attestation Statements:   Reviewed by clinician on day of visit: allergies, medications, problem list, medical history, surgical history, family history, social history, and previous encounter notes.  Time spent on visit including pre-visit chart review and post-visit care and charting was 25 minutes.   Edmund Hilda, CMA, am acting as transcriptionist for Reuben Likes, MD.  I have reviewed the above documentation for accuracy  and completeness, and I agree with the above. - Reuben Likes, MD

## 2020-11-10 ENCOUNTER — Encounter (INDEPENDENT_AMBULATORY_CARE_PROVIDER_SITE_OTHER): Payer: Self-pay | Admitting: Family Medicine

## 2020-11-10 ENCOUNTER — Ambulatory Visit (INDEPENDENT_AMBULATORY_CARE_PROVIDER_SITE_OTHER): Payer: 59 | Admitting: Family Medicine

## 2020-11-10 ENCOUNTER — Other Ambulatory Visit: Payer: Self-pay

## 2020-11-10 VITALS — BP 101/69 | HR 77 | Temp 98.1°F | Ht 62.0 in | Wt 179.0 lb

## 2020-11-10 DIAGNOSIS — E669 Obesity, unspecified: Secondary | ICD-10-CM

## 2020-11-10 DIAGNOSIS — E8881 Metabolic syndrome: Secondary | ICD-10-CM | POA: Diagnosis not present

## 2020-11-10 DIAGNOSIS — E7849 Other hyperlipidemia: Secondary | ICD-10-CM

## 2020-11-10 DIAGNOSIS — Z6834 Body mass index (BMI) 34.0-34.9, adult: Secondary | ICD-10-CM

## 2020-11-10 MED ORDER — TIRZEPATIDE 7.5 MG/0.5ML ~~LOC~~ SOAJ: 7.5000 mg | SUBCUTANEOUS | 0 refills | Status: DC

## 2020-11-10 NOTE — Progress Notes (Signed)
Chief Complaint:   OBESITY Jessica Frye is here to discuss her progress with her obesity treatment plan along with follow-up of her obesity related diagnoses. Jessica Frye is on the Category 2 Plan and states she is following her eating plan approximately 100% of the time. Jessica Frye states she is going to the gym 45 minutes 3 times per week.  Today's visit was #: 4 Starting weight: 186 lbs Starting date: 09/22/2020 Today's weight: 179 lbs Today's date: 11/10/2020 Total lbs lost to date: 7 Total lbs lost since last in-office visit: 1  Interim History: Jessica Frye is doing well on 5 mg Mounjaro. She had some nausea initially on 5 mg. Pt is able to get all food in on category 2. For snacks, she is eating almonds, fruit, and popcorn. She thinks she is going to [facility] for Thanksgiving and planning to stay in town for December. She wants to find high protein meals and snacks.  Subjective:   1. Insulin resistance Jessica Frye is doing well on Mounjaro 5 mg. Despite some nausea initially, she is doing well now. She took medication for nausea.   2. Other hyperlipidemia Pt's last LDL was 118, HDL 59, triglycerides 56, and total 188. She is not on statin therapy.  Assessment/Plan:   1. Insulin resistance Jessica Frye will increase Mounjaro to 7.5 mg and continue to work on weight loss, exercise, and decreasing simple carbohydrates to help decrease the risk of diabetes. Jessica Frye agreed to follow-up with [place] as directed to closely monitor her progress.  Increase and Refill- tirzepatide (MOUNJARO) 7.5 MG/0.5ML Pen; Inject 7.5 mg into the skin once a week.  Dispense: 6 mL; Refill: 0  2. Other hyperlipidemia Cardiovascular risk and specific lipid/LDL goals reviewed.  We discussed several lifestyle modifications today and Jessica Frye will continue to work on diet, exercise and weight loss efforts. Orders and follow up as documented in patient record. Repeat labs in 3 months.  Counseling Intensive lifestyle modifications are the first  line treatment for this issue. Dietary changes: Increase soluble fiber. Decrease simple carbohydrates. Exercise changes: Moderate to vigorous-intensity aerobic activity 150 minutes per week if tolerated. Lipid-lowering medications: see documented in medical record.  3. Obesity with current BMI of 32.8  Jessica Frye is currently in the action stage of change. As such, her goal is to continue with weight loss efforts. She has agreed to the Category 2 Plan and keeping a food journal and adhering to recommended goals of 1200-1300 calories and 80+ grams protein.   Exercise goals:  As is  Behavioral modification strategies: increasing lean protein intake, meal planning and cooking strategies, keeping healthy foods in the home, and travel eating strategies.  Jessica Frye has agreed to follow-up with our clinic in 3-4 weeks. She was informed of the importance of frequent follow-up visits to maximize her success with intensive lifestyle modifications for her multiple health conditions.   Objective:   Blood pressure 101/69, pulse 77, temperature 98.1 F (36.7 C), height 5\' 2"  (1.575 m), weight 179 lb (81.2 kg), SpO2 99 %. Body mass index is 32.74 kg/m.  General: Cooperative, alert, well developed, in no acute distress. HEENT: Conjunctivae and lids unremarkable. Cardiovascular: Regular rhythm.  Lungs: Normal work of breathing. Neurologic: No focal deficits.   Lab Results  Component Value Date   CREATININE 0.76 03/30/2020   BUN 11 03/30/2020   NA 137 03/30/2020   K 3.8 03/30/2020   CL 103 03/30/2020   CO2 27 03/30/2020   No results found for: ALT, AST, GGT, ALKPHOS, BILITOT  No results found for: HGBA1C Lab Results  Component Value Date   INSULIN 14.9 09/22/2020   No results found for: TSH No results found for: CHOL, HDL, LDLCALC, LDLDIRECT, TRIG, CHOLHDL Lab Results  Component Value Date   VD25OH 58.3 09/22/2020   Lab Results  Component Value Date   WBC 4.5 09/22/2020   HGB 13.2  09/22/2020   HCT 41.8 09/22/2020   MCV 89 09/22/2020   PLT 328 09/22/2020    Attestation Statements:   Reviewed by clinician on day of visit: allergies, medications, problem list, medical history, surgical history, family history, social history, and previous encounter notes.  Edmund Hilda, CMA, am acting as transcriptionist for Reuben Likes, MD.   I have reviewed the above documentation for accuracy and completeness, and I agree with the above. - Reuben Likes, MD

## 2020-11-11 ENCOUNTER — Encounter (INDEPENDENT_AMBULATORY_CARE_PROVIDER_SITE_OTHER): Payer: Self-pay

## 2020-12-06 ENCOUNTER — Encounter (INDEPENDENT_AMBULATORY_CARE_PROVIDER_SITE_OTHER): Payer: Self-pay | Admitting: Family Medicine

## 2020-12-07 ENCOUNTER — Other Ambulatory Visit (INDEPENDENT_AMBULATORY_CARE_PROVIDER_SITE_OTHER): Payer: Self-pay

## 2020-12-07 ENCOUNTER — Other Ambulatory Visit (HOSPITAL_COMMUNITY): Payer: Self-pay

## 2020-12-07 DIAGNOSIS — E8881 Metabolic syndrome: Secondary | ICD-10-CM

## 2020-12-07 MED ORDER — TIRZEPATIDE 7.5 MG/0.5ML ~~LOC~~ SOAJ
7.5000 mg | SUBCUTANEOUS | 0 refills | Status: DC
  Filled 2020-12-07 (×2): qty 2, 28d supply, fill #0

## 2020-12-08 ENCOUNTER — Other Ambulatory Visit (HOSPITAL_COMMUNITY): Payer: Self-pay

## 2020-12-09 ENCOUNTER — Ambulatory Visit (INDEPENDENT_AMBULATORY_CARE_PROVIDER_SITE_OTHER): Payer: 59 | Admitting: Family Medicine

## 2020-12-09 ENCOUNTER — Encounter (INDEPENDENT_AMBULATORY_CARE_PROVIDER_SITE_OTHER): Payer: Self-pay | Admitting: Family Medicine

## 2020-12-09 ENCOUNTER — Other Ambulatory Visit: Payer: Self-pay

## 2020-12-09 ENCOUNTER — Other Ambulatory Visit (HOSPITAL_COMMUNITY): Payer: Self-pay

## 2020-12-09 ENCOUNTER — Other Ambulatory Visit (INDEPENDENT_AMBULATORY_CARE_PROVIDER_SITE_OTHER): Payer: Self-pay

## 2020-12-09 VITALS — BP 114/73 | HR 77 | Temp 98.2°F | Ht 62.0 in | Wt 178.0 lb

## 2020-12-09 DIAGNOSIS — E8881 Metabolic syndrome: Secondary | ICD-10-CM | POA: Diagnosis not present

## 2020-12-09 DIAGNOSIS — Z6834 Body mass index (BMI) 34.0-34.9, adult: Secondary | ICD-10-CM

## 2020-12-09 DIAGNOSIS — E669 Obesity, unspecified: Secondary | ICD-10-CM

## 2020-12-09 MED ORDER — TIRZEPATIDE 5 MG/0.5ML ~~LOC~~ SOAJ: 5.0000 mg | SUBCUTANEOUS | 0 refills | Status: DC

## 2020-12-09 NOTE — Progress Notes (Signed)
Chief Complaint:   OBESITY Jessica Frye is here to discuss her progress with her obesity treatment plan along with follow-up of her obesity related diagnoses. Jessica Frye is on the Category 2 Plan and keeping a food journal and adhering to recommended goals of 1200-1300 calories and 80 grams protein and states she is following her eating plan approximately 70% of the time. Jessica Frye states she is working out 35 minutes 3 times per week.  Today's visit was #: 5 Starting weight: 186 lbs Starting date: 09/22/2020 Today's weight: 178 lbs Today's date: 12/09/2020 Total lbs lost to date: 8 Total lbs lost since last in-office visit: 1  Interim History: Jessica Frye is trying to be mindful of food intake- packing food and snacks. She went to [place] to see her mom over Thanksgiving. Pt wants to continue her exercise. She feels like she may be eating more with her increase in activity. Breakfast is egg or egg mcmuffin; Lunch is leftovers (pot roast) or homemade soup (veggie or egg); Dinner is protein and vegetable.  Subjective:   1. Insulin resistance Jessica Frye was previously on Mounjaro but had supple issues with getting refill.  Assessment/Plan:   1. Insulin resistance Jessica Frye will continue to work on weight loss, exercise, and decreasing simple carbohydrates to help decrease the risk of diabetes. Jessica Frye agreed to follow-up with [place] as directed to closely monitor her progress. Continue Mounjaro- may need to have Rx sent to a different pharmacy.  Refill Mounjaro 5 mg weekly, Disp 6 mL, 0 RF  2. Obesity with current BMI of 32.7  Jessica Frye is currently in the action stage of change. As such, her goal is to continue with weight loss efforts. She has agreed to the Category 2 Plan and keeping a food journal and adhering to recommended goals of 1200-1300 calories and 80+ grams protein.   Exercise goals:  As is  Behavioral modification strategies: increasing lean protein intake, meal planning and cooking strategies, keeping  healthy foods in the home, and planning for success.  Jessica Frye has agreed to follow-up with our clinic in 4 weeks. She was informed of the importance of frequent follow-up visits to maximize her success with intensive lifestyle modifications for her multiple health conditions.   Objective:   Blood pressure 114/73, pulse 77, temperature 98.2 F (36.8 C), height 5\' 2"  (1.575 m), weight 178 lb (80.7 kg), SpO2 100 %. Body mass index is 32.56 kg/m.  General: Cooperative, alert, well developed, in no acute distress. HEENT: Conjunctivae and lids unremarkable. Cardiovascular: Regular rhythm.  Lungs: Normal work of breathing. Neurologic: No focal deficits.   Lab Results  Component Value Date   CREATININE 0.76 03/30/2020   BUN 11 03/30/2020   NA 137 03/30/2020   K 3.8 03/30/2020   CL 103 03/30/2020   CO2 27 03/30/2020   No results found for: ALT, AST, GGT, ALKPHOS, BILITOT No results found for: HGBA1C Lab Results  Component Value Date   INSULIN 14.9 09/22/2020   No results found for: TSH No results found for: CHOL, HDL, LDLCALC, LDLDIRECT, TRIG, CHOLHDL Lab Results  Component Value Date   VD25OH 58.3 09/22/2020   Lab Results  Component Value Date   WBC 4.5 09/22/2020   HGB 13.2 09/22/2020   HCT 41.8 09/22/2020   MCV 89 09/22/2020   PLT 328 09/22/2020    Attestation Statements:   Reviewed by clinician on day of visit: allergies, medications, problem list, medical history, surgical history, family history, social history, and previous encounter notes.  I,  Kyung Rudd, CMA, am acting as transcriptionist for Reuben Likes, MD.   I have reviewed the above documentation for accuracy and completeness, and I agree with the above. - Reuben Likes, MD

## 2021-01-06 ENCOUNTER — Ambulatory Visit (INDEPENDENT_AMBULATORY_CARE_PROVIDER_SITE_OTHER): Payer: 59 | Admitting: Family Medicine

## 2021-01-06 ENCOUNTER — Other Ambulatory Visit: Payer: Self-pay

## 2021-01-06 ENCOUNTER — Encounter (INDEPENDENT_AMBULATORY_CARE_PROVIDER_SITE_OTHER): Payer: Self-pay | Admitting: Family Medicine

## 2021-01-06 VITALS — BP 106/73 | HR 72 | Temp 98.1°F | Ht 62.0 in | Wt 173.0 lb

## 2021-01-06 DIAGNOSIS — E7849 Other hyperlipidemia: Secondary | ICD-10-CM | POA: Diagnosis not present

## 2021-01-06 DIAGNOSIS — Z6834 Body mass index (BMI) 34.0-34.9, adult: Secondary | ICD-10-CM

## 2021-01-06 DIAGNOSIS — E669 Obesity, unspecified: Secondary | ICD-10-CM | POA: Diagnosis not present

## 2021-01-06 DIAGNOSIS — Z6831 Body mass index (BMI) 31.0-31.9, adult: Secondary | ICD-10-CM

## 2021-01-06 DIAGNOSIS — E8881 Metabolic syndrome: Secondary | ICD-10-CM

## 2021-01-06 MED ORDER — TIRZEPATIDE 7.5 MG/0.5ML ~~LOC~~ SOAJ: 7.5000 mg | SUBCUTANEOUS | 0 refills | Status: DC

## 2021-01-10 NOTE — Progress Notes (Signed)
Chief Complaint:   OBESITY Reality is here to discuss her progress with her obesity treatment plan along with follow-up of her obesity related diagnoses. Jessica Frye is on keeping a food journal and adhering to recommended goals of 1200-1300 calories and 80+ grams protein and states she is following her eating plan approximately 80% of the time. Jessica Frye states she is not currently exercising.  Today's visit was #: 6 Starting weight: 186 lbs Starting date: 09/22/2020 Today's weight: 173 lbs Today's date: 01/06/2021 Total lbs lost to date: 13 Total lbs lost since last in-office visit: 5  Interim History: Pt had a low key holiday. She is just starting to get back into activities this week. Pt is doing really well on her journaling. She is eating at lunch now and doing mostly salad. Calorie wise, she is trying to stay around 1150-1200. Pt wants to get more consistent with her exercise.  Subjective:   1. Insulin resistance The last few weeks, pt has done well in being in control of carb intake. She has done well on Mounjaro 5 mg.  2. Other hyperlipidemia Pt has an LDL of 118, HDL 59, triglycerides 56, and total 188. She is doing well with journaling.  Assessment/Plan:   1. Insulin resistance Jessica Frye will increase Mounjaro to 7.5 mg as prescribed and continue to work on weight loss, exercise, and decreasing simple carbohydrates to help decrease the risk of diabetes. Jessica Frye agreed to follow-up with [place] as directed to closely monitor her progress.  Increase and Refill- tirzepatide (MOUNJARO) 7.5 MG/0.5ML Pen; Inject 7.5 mg into the skin once a week.  Dispense: 2 mL; Refill: 0  2. Other hyperlipidemia Cardiovascular risk and specific lipid/LDL goals reviewed.  We discussed several lifestyle modifications today and Jessica Frye will continue to work on diet, exercise and weight loss efforts. Orders and follow up as documented in patient record. Repeat labs in March.  Counseling Intensive lifestyle  modifications are the first line treatment for this issue. Dietary changes: Increase soluble fiber. Decrease simple carbohydrates. Exercise changes: Moderate to vigorous-intensity aerobic activity 150 minutes per week if tolerated. Lipid-lowering medications: see documented in medical record.  3. Obesity with current BMI of 31.8  Jessica Frye is currently in the action stage of change. As such, her goal is to continue with weight loss efforts. She has agreed to keeping a food journal and adhering to recommended goals of 1200-1300 calories and 85+ grams protein.   Exercise goals: All adults should avoid inactivity. Some physical activity is better than none, and adults who participate in any amount of physical activity gain some health benefits.  Behavioral modification strategies: increasing lean protein intake, meal planning and cooking strategies, keeping healthy foods in the home, planning for success, and keeping a strict food journal.  Jessica Frye has agreed to follow-up with our clinic in 3 weeks. She was informed of the importance of frequent follow-up visits to maximize her success with intensive lifestyle modifications for her multiple health conditions.    Objective:   Blood pressure 106/73, pulse 72, temperature 98.1 F (36.7 C), height 5\' 2"  (1.575 m), weight 173 lb (78.5 kg), SpO2 99 %. Body mass index is 31.64 kg/m.  General: Cooperative, alert, well developed, in no acute distress. HEENT: Conjunctivae and lids unremarkable. Cardiovascular: Regular rhythm.  Lungs: Normal work of breathing. Neurologic: No focal deficits.   Lab Results  Component Value Date   CREATININE 0.76 03/30/2020   BUN 11 03/30/2020   NA 137 03/30/2020   K 3.8  03/30/2020   CL 103 03/30/2020   CO2 27 03/30/2020   No results found for: ALT, AST, GGT, ALKPHOS, BILITOT No results found for: HGBA1C Lab Results  Component Value Date   INSULIN 14.9 09/22/2020   No results found for: TSH No results found  for: CHOL, HDL, LDLCALC, LDLDIRECT, TRIG, CHOLHDL Lab Results  Component Value Date   VD25OH 58.3 09/22/2020   Lab Results  Component Value Date   WBC 4.5 09/22/2020   HGB 13.2 09/22/2020   HCT 41.8 09/22/2020   MCV 89 09/22/2020   PLT 328 09/22/2020    Attestation Statements:   Reviewed by clinician on day of visit: allergies, medications, problem list, medical history, surgical history, family history, social history, and previous encounter notes.  Coral Ceo, CMA, am acting as transcriptionist for Coralie Common, MD.   I have reviewed the above documentation for accuracy and completeness, and I agree with the above. - Coralie Common, MD

## 2021-01-11 ENCOUNTER — Other Ambulatory Visit: Payer: 59

## 2021-01-26 ENCOUNTER — Other Ambulatory Visit: Payer: Self-pay

## 2021-01-26 ENCOUNTER — Encounter (INDEPENDENT_AMBULATORY_CARE_PROVIDER_SITE_OTHER): Payer: Self-pay | Admitting: Family Medicine

## 2021-01-26 ENCOUNTER — Ambulatory Visit (INDEPENDENT_AMBULATORY_CARE_PROVIDER_SITE_OTHER): Payer: 59 | Admitting: Family Medicine

## 2021-01-26 VITALS — BP 108/62 | HR 83 | Temp 98.2°F | Ht 62.0 in | Wt 171.0 lb

## 2021-01-26 DIAGNOSIS — E559 Vitamin D deficiency, unspecified: Secondary | ICD-10-CM | POA: Diagnosis not present

## 2021-01-26 DIAGNOSIS — E8881 Metabolic syndrome: Secondary | ICD-10-CM | POA: Diagnosis not present

## 2021-01-26 DIAGNOSIS — E669 Obesity, unspecified: Secondary | ICD-10-CM

## 2021-01-26 DIAGNOSIS — Z6831 Body mass index (BMI) 31.0-31.9, adult: Secondary | ICD-10-CM | POA: Diagnosis not present

## 2021-01-26 DIAGNOSIS — Z6834 Body mass index (BMI) 34.0-34.9, adult: Secondary | ICD-10-CM

## 2021-01-26 MED ORDER — TIRZEPATIDE 7.5 MG/0.5ML ~~LOC~~ SOAJ: 7.5000 mg | SUBCUTANEOUS | 0 refills | Status: DC

## 2021-01-26 NOTE — Progress Notes (Signed)
Chief Complaint:   OBESITY Jessica Frye is here to discuss her progress with her obesity treatment plan along with follow-up of her obesity related diagnoses. Jessica Frye is on keeping a food journal and adhering to recommended goals of 1200-1300 calories and 85+ grams protein and states she is following her eating plan approximately 100% of the time. Jessica Frye states she is doing cardio 35-40 minutes 3 times per week.  Today's visit was #: 7 Starting weight: 186 lbs Starting date: 09/22/2020 Today's weight: 171 lbs Today's date: 01/26/2021 Total lbs lost to date: 15 Total lbs lost since last in-office visit: 2  Interim History: Pt has been logging food since her last appt. She is averaging about 71 grams protein a day and staying around. She is wondering about calories and protein goals for the day. Pt is trying to continue her physical activity amount. She has no upcoming trips or celebrations (daughter's b-day in March).  Subjective:   1. Insulin resistance Pt is on Mounjaro 7.5 mg and averaging about 1000 cal/day.  2. Vitamin D deficiency Pt is not on Rx Vit D. Her last Vit D level was at goal.  Assessment/Plan:   1. Insulin resistance Jessica Frye will continue to work on weight loss, exercise, and decreasing simple carbohydrates to help decrease the risk of diabetes. Jessica Frye agreed to follow-up with [place] as directed to closely monitor her progress. Pt encouraged to increase calorie intake to 1200-1300 cal and at least 85 grams protein.  Refill- tirzepatide (MOUNJARO) 7.5 MG/0.5ML Pen; Inject 7.5 mg into the skin once a week.  Dispense: 2 mL; Refill: 0  2. Vitamin D deficiency Low Vitamin D level contributes to fatigue and are associated with obesity, breast, and colon cancer. She will follow-up for routine testing of Vitamin D, at least 2-3 times per year to avoid over-replacement. Repeat labs in a month.  3. Obesity with current BMI of 31.3 Jessica Frye is currently in the action stage of change.  As such, her goal is to continue with weight loss efforts. She has agreed to keeping a food journal and adhering to recommended goals of 1200-1300 calories and 85+ grams protein.   Exercise goals:  As is  Behavioral modification strategies: increasing lean protein intake, meal planning and cooking strategies, keeping healthy foods in the home, planning for success, and keeping a strict food journal.  Jessica Frye has agreed to follow-up with our clinic in 4 weeks. She was informed of the importance of frequent follow-up visits to maximize her success with intensive lifestyle modifications for her multiple health conditions.   Objective:   Blood pressure 108/62, pulse 83, temperature 98.2 F (36.8 C), height 5\' 2"  (1.575 m), weight 171 lb (77.6 kg), SpO2 98 %. Body mass index is 31.28 kg/m.  General: Cooperative, alert, well developed, in no acute distress. HEENT: Conjunctivae and lids unremarkable. Cardiovascular: Regular rhythm.  Lungs: Normal work of breathing. Neurologic: No focal deficits.   Lab Results  Component Value Date   CREATININE 0.76 03/30/2020   BUN 11 03/30/2020   NA 137 03/30/2020   K 3.8 03/30/2020   CL 103 03/30/2020   CO2 27 03/30/2020   No results found for: ALT, AST, GGT, ALKPHOS, BILITOT No results found for: HGBA1C Lab Results  Component Value Date   INSULIN 14.9 09/22/2020   No results found for: TSH No results found for: CHOL, HDL, LDLCALC, LDLDIRECT, TRIG, CHOLHDL Lab Results  Component Value Date   VD25OH 58.3 09/22/2020   Lab Results  Component  Value Date   WBC 4.5 09/22/2020   HGB 13.2 09/22/2020   HCT 41.8 09/22/2020   MCV 89 09/22/2020   PLT 328 09/22/2020   Attestation Statements:   Reviewed by clinician on day of visit: allergies, medications, problem list, medical history, surgical history, family history, social history, and previous encounter notes.  Edmund Hilda, CMA, am acting as transcriptionist for Reuben Likes, MD.    I have reviewed the above documentation for accuracy and completeness, and I agree with the above. - Reuben Likes, MD

## 2021-01-27 ENCOUNTER — Encounter (INDEPENDENT_AMBULATORY_CARE_PROVIDER_SITE_OTHER): Payer: Self-pay

## 2021-02-01 ENCOUNTER — Inpatient Hospital Stay: Payer: 59 | Attending: Oncology | Admitting: Oncology

## 2021-02-28 ENCOUNTER — Other Ambulatory Visit: Payer: Self-pay

## 2021-02-28 ENCOUNTER — Ambulatory Visit (INDEPENDENT_AMBULATORY_CARE_PROVIDER_SITE_OTHER): Payer: 59 | Admitting: Family Medicine

## 2021-02-28 ENCOUNTER — Encounter (INDEPENDENT_AMBULATORY_CARE_PROVIDER_SITE_OTHER): Payer: Self-pay | Admitting: Family Medicine

## 2021-02-28 VITALS — BP 91/62 | HR 81 | Temp 98.3°F | Ht 62.0 in | Wt 163.0 lb

## 2021-02-28 DIAGNOSIS — E8881 Metabolic syndrome: Secondary | ICD-10-CM

## 2021-02-28 DIAGNOSIS — E7849 Other hyperlipidemia: Secondary | ICD-10-CM

## 2021-02-28 DIAGNOSIS — Z6834 Body mass index (BMI) 34.0-34.9, adult: Secondary | ICD-10-CM

## 2021-02-28 DIAGNOSIS — E669 Obesity, unspecified: Secondary | ICD-10-CM

## 2021-02-28 DIAGNOSIS — Z6831 Body mass index (BMI) 31.0-31.9, adult: Secondary | ICD-10-CM

## 2021-02-28 DIAGNOSIS — E559 Vitamin D deficiency, unspecified: Secondary | ICD-10-CM

## 2021-02-28 MED ORDER — TIRZEPATIDE 7.5 MG/0.5ML ~~LOC~~ SOAJ: 7.5000 mg | SUBCUTANEOUS | 0 refills | Status: DC

## 2021-02-28 NOTE — Progress Notes (Signed)
Chief Complaint:   OBESITY Jessica Frye is here to discuss her progress with her obesity treatment plan along with follow-up of her obesity related diagnoses. Jessica Frye is on keeping a food journal and adhering to recommended goals of 1200-1300 calories and 85+ grams protein and states she is following her eating plan approximately 90% of the time. Jessica Frye states she is going to the gym 45 minutes 3 times per week.  Today's visit was #: 8 Starting weight: 186 lbs Starting date: 09/22/2020 Today's weight: 163 lbs Today's date: 02/28/2021 Total lbs lost to date: 23 Total lbs lost since last in-office visit: 8  Interim History: Pt just got over strep throat and ear infection and has been dealing with symptoms over the last 3 weeks. She has been eating much more protein during the last few weeks. She is on journaling and exercising. Protein wise, she is getting about 90 grams protein daily. Pt is going to [place] at the end of  April. She wants to get outside more in the next few weeks. Pt's biggest obstacle in the next few weeks is sticking to the meal plan.  Subjective:   1. Insulin resistance Jessica Frye is dong well on Mounjaro with no side effects noted.  2. Other hyperlipidemia Jessica Frye has an LDL of 118, HDL 59, and triglycerides 56 and is not on statin therapy.  3. Vitamin D deficiency Pt was previously on OTC Vit D but is not taking it now. Her last Vit D level was 58.3.  Assessment/Plan:   1. Insulin resistance Jessica Frye will continue to work on weight loss, exercise, and decreasing simple carbohydrates to help decrease the risk of diabetes. Jessica Frye agreed to follow-up with [place] as directed to closely monitor her progress. Check labs today.  Refill- tirzepatide (MOUNJARO) 7.5 MG/0.5ML Pen; Inject 7.5 mg into the skin once a week.  Dispense: 2 mL; Refill: 0  - Hemoglobin A1c - Insulin, random - Comprehensive metabolic panel  2. Other hyperlipidemia Cardiovascular risk and specific  lipid/LDL goals reviewed.  We discussed several lifestyle modifications today and Jessica Frye will continue to work on diet, exercise and weight loss efforts. Orders and follow up as documented in patient record.   Counseling Intensive lifestyle modifications are the first line treatment for this issue. Dietary changes: Increase soluble fiber. Decrease simple carbohydrates. Exercise changes: Moderate to vigorous-intensity aerobic activity 150 minutes per week if tolerated. Lipid-lowering medications: see documented in medical record. Check labs today.  - Lipid Panel With LDL/HDL Ratio  3. Vitamin D deficiency Low Vitamin D level contributes to fatigue and are associated with obesity, breast, and colon cancer. She will follow-up for routine testing of Vitamin D, at least 2-3 times per year to avoid over-replacement. Check labs today.  - VITAMIN D 25 Hydroxy (Vit-D Deficiency, Fractures)  4. Obesity with current BMI of 31.3 Jessica Frye is currently in the action stage of change. As such, her goal is to continue with weight loss efforts. She has agreed to keeping a food journal and adhering to recommended goals of 1200-1300 calories and 85+ grams protein.   Exercise goals:  As is  Behavioral modification strategies: increasing lean protein intake, meal planning and cooking strategies, keeping healthy foods in the home, and planning for success.  Jessica Frye has agreed to follow-up with our clinic in 3-4 weeks. She was informed of the importance of frequent follow-up visits to maximize her success with intensive lifestyle modifications for her multiple health conditions.   Jessica Frye was informed we would  discuss her lab results at her next visit unless there is a critical issue that needs to be addressed sooner. Jessica Frye agreed to keep her next visit at the agreed upon time to discuss these results.  Objective:   Blood pressure 91/62, pulse 81, temperature 98.3 F (36.8 C), height 5\' 2"  (1.575 m), weight 163  lb (73.9 kg), SpO2 100 %. Body mass index is 29.81 kg/m.  General: Cooperative, alert, well developed, in no acute distress. HEENT: Conjunctivae and lids unremarkable. Cardiovascular: Regular rhythm.  Lungs: Normal work of breathing. Neurologic: No focal deficits.   Lab Results  Component Value Date   CREATININE 0.76 03/30/2020   BUN 11 03/30/2020   NA 137 03/30/2020   K 3.8 03/30/2020   CL 103 03/30/2020   CO2 27 03/30/2020   No results found for: ALT, AST, GGT, ALKPHOS, BILITOT No results found for: HGBA1C Lab Results  Component Value Date   INSULIN 14.9 09/22/2020   No results found for: TSH No results found for: CHOL, HDL, LDLCALC, LDLDIRECT, TRIG, CHOLHDL Lab Results  Component Value Date   VD25OH 58.3 09/22/2020   Lab Results  Component Value Date   WBC 4.5 09/22/2020   HGB 13.2 09/22/2020   HCT 41.8 09/22/2020   MCV 89 09/22/2020   PLT 328 09/22/2020   Attestation Statements:   Reviewed by clinician on day of visit: allergies, medications, problem list, medical history, surgical history, family history, social history, and previous encounter notes.  Coral Ceo, CMA, am acting as transcriptionist for Coralie Common, MD.   I have reviewed the above documentation for accuracy and completeness, and I agree with the above. - Coralie Common, MD

## 2021-03-01 ENCOUNTER — Encounter (INDEPENDENT_AMBULATORY_CARE_PROVIDER_SITE_OTHER): Payer: Self-pay

## 2021-03-01 LAB — LIPID PANEL WITH LDL/HDL RATIO
Cholesterol, Total: 156 mg/dL (ref 100–199)
HDL: 41 mg/dL (ref >39)
LDL Chol Calc (NIH): 104 mg/dL — ABNORMAL HIGH (ref 0–99)
LDL/HDL Ratio: 2.5 ratio (ref 0.0–3.2)
Triglycerides: 54 mg/dL (ref 0–149)
VLDL Cholesterol Cal: 11 mg/dL (ref 5–40)

## 2021-03-01 LAB — COMPREHENSIVE METABOLIC PANEL
ALT: 11 IU/L (ref 0–32)
AST: 13 IU/L (ref 0–40)
Albumin/Globulin Ratio: 1.8 (ref 1.2–2.2)
Albumin: 4.2 g/dL (ref 3.8–4.8)
Alkaline Phosphatase: 77 IU/L (ref 44–121)
BUN/Creatinine Ratio: 13 (ref 9–23)
BUN: 9 mg/dL (ref 6–24)
Bilirubin Total: 0.3 mg/dL (ref 0.0–1.2)
CO2: 23 mmol/L (ref 20–29)
Calcium: 9.9 mg/dL (ref 8.7–10.2)
Chloride: 105 mmol/L (ref 96–106)
Creatinine, Ser: 0.72 mg/dL (ref 0.57–1.00)
Globulin, Total: 2.3 g/dL (ref 1.5–4.5)
Glucose: 76 mg/dL (ref 70–99)
Potassium: 4.3 mmol/L (ref 3.5–5.2)
Sodium: 141 mmol/L (ref 134–144)
Total Protein: 6.5 g/dL (ref 6.0–8.5)
eGFR: 104 mL/min/{1.73_m2} (ref >59)

## 2021-03-01 LAB — INSULIN, RANDOM: INSULIN: 10.2 u[IU]/mL (ref 2.6–24.9)

## 2021-03-01 LAB — HEMOGLOBIN A1C
Est. average glucose Bld gHb Est-mCnc: 100 mg/dL
Hgb A1c MFr Bld: 5.1 % (ref 4.8–5.6)

## 2021-03-01 LAB — VITAMIN D 25 HYDROXY (VIT D DEFICIENCY, FRACTURES): Vit D, 25-Hydroxy: 38.2 ng/mL (ref 30.0–100.0)

## 2021-03-28 ENCOUNTER — Ambulatory Visit (INDEPENDENT_AMBULATORY_CARE_PROVIDER_SITE_OTHER): Payer: 59 | Admitting: Family Medicine

## 2021-03-30 ENCOUNTER — Other Ambulatory Visit: Payer: Self-pay

## 2021-03-30 ENCOUNTER — Encounter (INDEPENDENT_AMBULATORY_CARE_PROVIDER_SITE_OTHER): Payer: Self-pay

## 2021-03-30 ENCOUNTER — Ambulatory Visit (INDEPENDENT_AMBULATORY_CARE_PROVIDER_SITE_OTHER): Payer: 59 | Admitting: Family Medicine

## 2021-03-30 ENCOUNTER — Encounter (INDEPENDENT_AMBULATORY_CARE_PROVIDER_SITE_OTHER): Payer: Self-pay | Admitting: Family Medicine

## 2021-03-30 VITALS — BP 100/68 | HR 72 | Temp 98.0°F | Ht 62.0 in | Wt 160.0 lb

## 2021-03-30 DIAGNOSIS — Z6829 Body mass index (BMI) 29.0-29.9, adult: Secondary | ICD-10-CM

## 2021-03-30 DIAGNOSIS — E8881 Metabolic syndrome: Secondary | ICD-10-CM | POA: Diagnosis not present

## 2021-03-30 DIAGNOSIS — E559 Vitamin D deficiency, unspecified: Secondary | ICD-10-CM | POA: Diagnosis not present

## 2021-03-30 DIAGNOSIS — Z9189 Other specified personal risk factors, not elsewhere classified: Secondary | ICD-10-CM | POA: Diagnosis not present

## 2021-03-30 DIAGNOSIS — E669 Obesity, unspecified: Secondary | ICD-10-CM

## 2021-03-30 MED ORDER — TIRZEPATIDE 7.5 MG/0.5ML ~~LOC~~ SOAJ: 7.5000 mg | SUBCUTANEOUS | 0 refills | Status: DC

## 2021-03-30 MED ORDER — VITAMIN D (ERGOCALCIFEROL) 1.25 MG (50000 UNIT) PO CAPS: 50000.0000 [IU] | ORAL_CAPSULE | ORAL | 0 refills | Status: DC

## 2021-04-04 NOTE — Progress Notes (Signed)
? ? ? ?Chief Complaint:  ? ?OBESITY ?Jessica Frye is here to discuss her progress with her obesity treatment plan along with follow-up of her obesity related diagnoses. Jessica Frye is on keeping a food journal and adhering to recommended goals of 1200-1300 calories and 85+ grams of protein and states she is following her eating plan approximately 100% of the time. Jessica Frye states she is at the gym 45 minutes 3 times per week. ? ?Today's visit was #: 9 ?Starting weight: 186 lbs ?Starting date: 09/22/2020 ?Today's weight: 160 lbs ?Today's date: 03/30/2021 ?Total lbs lost to date: 52 ?Total lbs lost since last in-office visit: 3 ? ?Interim History: Jessica Frye had a colonoscopy yesterday. She is working out 45 minutes, 3x's a week and wants to increase to 4x's a week. She wants to start walking at lunch. She is sticking to journaling and focusing on protein. She is doing more resistance training. Jessica Frye is going to [place] at the end if April with her mom and sister. ? ?Subjective:  ? ?1. Vitamin D deficiency ?Daneisha is not currently taking a Vit D supplement. Her levels has decreased from 58 to 38. ? ?2. Insulin resistance ?Wrigley is currently taking Mounjaro. Her A1C has decreased from 5.3 to 5.1. Insulin decreased from 14-10. ? ?3. At risk for activity intolerance ?Jessica Frye will like to increase her workouts to 4 times a week and start walking during lunch. ? ?Assessment/Plan:  ? ?1. Vitamin D deficiency ?Jessica Frye will start prescription Vit D 50k for 1 month with no refills. ? ?- Fill Vitamin D, Ergocalciferol, (DRISDOL) 1.25 MG (50000 UNIT) CAPS capsule; Take 1 capsule (50,000 Units total) by mouth every 7 (seven) days.  Dispense: 4 capsule; Refill: 0 ? ?2. Insulin resistance ?We will refill Mounjaro 7.5 mg fir 1 month with no refills. ? ?- Refill tirzepatide (MOUNJARO) 7.5 MG/0.5ML Pen; Inject 7.5 mg into the skin once a week.  Dispense: 2 mL; Refill: 0 ? ?3. At risk for activity intolerance ?Caress was given approximately 15 minutes of  exercise intolerance counseling today. She is 46 y.o. female and has risk factors exercise intolerance including obesity. We discussed intensive lifestyle modifications today with an emphasis on specific weight loss instructions and strategies. Jessica Frye will slowly increase activity as tolerated. ? ?Repetitive spaced learning was employed today to elicit superior memory formation and behavioral change. ? ?4. Obesity with current BMI of 29.4 ?Jessica Frye is currently in the action stage of change. As such, her goal is to continue with weight loss efforts. She has agreed to keeping a food journal and adhering to recommended goals of 1200-1300 calories and 85+ grams of protein daily. ? ?Exercise goals: As is. She can increase activity. ? ?Behavioral modification strategies: increasing lean protein intake, meal planning and cooking strategies, keeping healthy foods in the home, and planning for success. ? ?Lloyd has agreed to follow-up with our clinic in 4 weeks. She was informed of the importance of frequent follow-up visits to maximize her success with intensive lifestyle modifications for her multiple health conditions.  ? ?Objective:  ? ?Blood pressure 100/68, pulse 72, temperature 98 ?F (36.7 ?C), height 5\' 2"  (1.575 m), weight 160 lb (72.6 kg), SpO2 99 %. ?Body mass index is 29.26 kg/m?. ? ?General: Cooperative, alert, well developed, in no acute distress. ?HEENT: Conjunctivae and lids unremarkable. ?Cardiovascular: Regular rhythm.  ?Lungs: Normal work of breathing. ?Neurologic: No focal deficits.  ? ?Lab Results  ?Component Value Date  ? CREATININE 0.72 02/28/2021  ? BUN 9 02/28/2021  ?  NA 141 02/28/2021  ? K 4.3 02/28/2021  ? CL 105 02/28/2021  ? CO2 23 02/28/2021  ? ?Lab Results  ?Component Value Date  ? ALT 11 02/28/2021  ? AST 13 02/28/2021  ? ALKPHOS 77 02/28/2021  ? BILITOT 0.3 02/28/2021  ? ?Lab Results  ?Component Value Date  ? HGBA1C 5.1 02/28/2021  ? ?Lab Results  ?Component Value Date  ? INSULIN 10.2  02/28/2021  ? INSULIN 14.9 09/22/2020  ? ?No results found for: TSH ?Lab Results  ?Component Value Date  ? CHOL 156 02/28/2021  ? HDL 41 02/28/2021  ? LDLCALC 104 (H) 02/28/2021  ? TRIG 54 02/28/2021  ? ?Lab Results  ?Component Value Date  ? VD25OH 38.2 02/28/2021  ? VD25OH 58.3 09/22/2020  ? ?Lab Results  ?Component Value Date  ? WBC 4.5 09/22/2020  ? HGB 13.2 09/22/2020  ? HCT 41.8 09/22/2020  ? MCV 89 09/22/2020  ? PLT 328 09/22/2020  ? ?No results found for: IRON, TIBC, FERRITIN ? ?Attestation Statements:  ? ?Reviewed by clinician on day of visit: allergies, medications, problem list, medical history, surgical history, family history, social history, and previous encounter notes. ? ?I, Brendell Tyus, am acting as transcriptionist for Reuben Likes, MD. ?I have reviewed the above documentation for accuracy and completeness, and I agree with the above. Reuben Likes, MD ? ?

## 2021-04-17 ENCOUNTER — Other Ambulatory Visit (INDEPENDENT_AMBULATORY_CARE_PROVIDER_SITE_OTHER): Payer: Self-pay | Admitting: Family Medicine

## 2021-04-17 DIAGNOSIS — E8881 Metabolic syndrome: Secondary | ICD-10-CM

## 2021-04-18 NOTE — Telephone Encounter (Signed)
Please advise 

## 2021-04-19 ENCOUNTER — Encounter (INDEPENDENT_AMBULATORY_CARE_PROVIDER_SITE_OTHER): Payer: Self-pay | Admitting: Family Medicine

## 2021-04-19 ENCOUNTER — Ambulatory Visit (INDEPENDENT_AMBULATORY_CARE_PROVIDER_SITE_OTHER): Payer: 59 | Admitting: Family Medicine

## 2021-04-19 VITALS — BP 116/71 | HR 71 | Temp 98.2°F | Ht 62.0 in | Wt 160.0 lb

## 2021-04-19 DIAGNOSIS — E559 Vitamin D deficiency, unspecified: Secondary | ICD-10-CM

## 2021-04-19 DIAGNOSIS — E669 Obesity, unspecified: Secondary | ICD-10-CM

## 2021-04-19 DIAGNOSIS — Z6829 Body mass index (BMI) 29.0-29.9, adult: Secondary | ICD-10-CM

## 2021-04-19 DIAGNOSIS — E8881 Metabolic syndrome: Secondary | ICD-10-CM

## 2021-04-19 MED ORDER — VITAMIN D (ERGOCALCIFEROL) 1.25 MG (50000 UNIT) PO CAPS: 50000.0000 [IU] | ORAL_CAPSULE | ORAL | 0 refills | Status: DC

## 2021-04-27 NOTE — Progress Notes (Signed)
Chief Complaint:   OBESITY Jessica Frye is here to discuss her progress with her obesity treatment plan along with follow-up of her obesity related diagnoses. Jessica Frye is on keeping a food journal and adhering to recommended goals of 1,200 to 1,300 calories and 85+ grams of protein and states she is following her eating plan approximately 100% of the time. Jessica Frye states she is going to the gym 45 minutes 3 times per week and walking for 35 to 40 minutes 1 time per week.  Today's visit was #: 10 Starting weight: 186 lbs Starting date: 09/22/2020 Today's weight: 160 lbs Today's date: 04/19/2021 Total lbs lost to date: 26 Total lbs lost since last in-office visit: 0  Interim History: Jessica Frye has been without Mounjaro 7.5mg  due to supply issues. She keeps being told that her dose will be available. Journaling wise, she has been staying on journaling numbers in terms of calories and protein. Jessica Frye is going to [place] with her mom and sister at the end of the month. She thinks that she does well with protein intake.  Subjective:   1. Vitamin D deficiency Jessica Frye's last vitamin D level was at the end of February. She is on prescription vitamin D. Jessica Frye notes fatigue and denies nausea, vomiting, and muscle weakness.  2. Insulin resistance Jessica Frye's last A1c was 5.1 and Insulin was 10.2. She is doing well on Mounjaro, but hasn't been able to get the 7.5mg .  Assessment/Plan:   1. Vitamin D deficiency Low Vitamin D level contributes to fatigue and are associated with obesity, breast, and colon cancer. She agrees to continue to take prescription Vitamin D @50 ,000 IU every week and will follow-up for routine testing of Vitamin D, at least 2-3 times per year to avoid over-replacement.  - Vitamin D, Ergocalciferol, (DRISDOL) 1.25 MG (50000 UNIT) CAPS capsule; Take 1 capsule (50,000 Units total) by mouth every 7 (seven) days.  Dispense: 4 capsule; Refill: 0  2. Insulin resistance Jessica Frye is to send a  Mychart message as we may need to decrease to 5mg , if that dose is available. Jessica Frye will continue to work on weight loss, exercise, and decreasing simple carbohydrates to help decrease the risk of diabetes. Jessica Frye agreed to follow-up with as directed to closely monitor her progress.  Obesity with current BMI of 29.4 Jessica Frye is currently in the action stage of change. As such, her goal is to continue with weight loss efforts. She has agreed to keeping a food journal and adhering to recommended goals of 1,200 to 1,300 calories and 85+ grams of protein.   Exercise goals:  Discussed isometric training.  Behavioral modification strategies: increasing lean protein intake, meal planning and cooking strategies, keeping healthy foods in the home and planning for success.  Jessica Frye has agreed to follow-up with our clinic in 4 weeks. She was informed of the importance of frequent follow-up visits to maximize her success with intensive lifestyle modifications for her multiple health conditions.   Objective:   Blood pressure 116/71, pulse 71, temperature 98.2 F (36.8 C), height 5\' 2"  (1.575 m), weight 160 lb (72.6 kg), SpO2 98 %. Body mass index is 29.26 kg/m.  General: Cooperative, alert, well developed, in no acute distress. HEENT: Conjunctivae and lids unremarkable. Cardiovascular: Regular rhythm.  Lungs: Normal work of breathing. Neurologic: No focal deficits.   Lab Results  Component Value Date   CREATININE 0.72 02/28/2021   BUN 9 02/28/2021   NA 141 02/28/2021   K 4.3 02/28/2021   CL  105 02/28/2021   CO2 23 02/28/2021   Lab Results  Component Value Date   ALT 11 02/28/2021   AST 13 02/28/2021   ALKPHOS 77 02/28/2021   BILITOT 0.3 02/28/2021   Lab Results  Component Value Date   HGBA1C 5.1 02/28/2021   Lab Results  Component Value Date   INSULIN 10.2 02/28/2021   INSULIN 14.9 09/22/2020   No results found for: TSH Lab Results  Component Value Date   CHOL 156 02/28/2021    HDL 41 02/28/2021   LDLCALC 104 (H) 02/28/2021   TRIG 54 02/28/2021   Lab Results  Component Value Date   VD25OH 38.2 02/28/2021   VD25OH 58.3 09/22/2020   Lab Results  Component Value Date   WBC 4.5 09/22/2020   HGB 13.2 09/22/2020   HCT 41.8 09/22/2020   MCV 89 09/22/2020   PLT 328 09/22/2020   No results found for: IRON, TIBC, FERRITIN  Attestation Statements:   Reviewed by clinician on day of visit: allergies, medications, problem list, medical history, surgical history, family history, social history, and previous encounter notes.  IKirke Corin, CMA, am acting as transcriptionist for Reuben Likes, MD  I have reviewed the above documentation for accuracy and completeness, and I agree with the above. - Reuben Likes, MD

## 2021-05-18 ENCOUNTER — Encounter (INDEPENDENT_AMBULATORY_CARE_PROVIDER_SITE_OTHER): Payer: Self-pay | Admitting: Family Medicine

## 2021-05-18 ENCOUNTER — Ambulatory Visit (INDEPENDENT_AMBULATORY_CARE_PROVIDER_SITE_OTHER): Payer: 59 | Admitting: Family Medicine

## 2021-05-18 VITALS — BP 121/70 | HR 72 | Temp 98.0°F | Ht 62.0 in | Wt 156.0 lb

## 2021-05-18 DIAGNOSIS — E8881 Metabolic syndrome: Secondary | ICD-10-CM

## 2021-05-18 DIAGNOSIS — E669 Obesity, unspecified: Secondary | ICD-10-CM | POA: Diagnosis not present

## 2021-05-18 DIAGNOSIS — Z9189 Other specified personal risk factors, not elsewhere classified: Secondary | ICD-10-CM

## 2021-05-18 DIAGNOSIS — E559 Vitamin D deficiency, unspecified: Secondary | ICD-10-CM

## 2021-05-18 DIAGNOSIS — Z6828 Body mass index (BMI) 28.0-28.9, adult: Secondary | ICD-10-CM

## 2021-05-18 MED ORDER — TIRZEPATIDE 7.5 MG/0.5ML ~~LOC~~ SOAJ: 7.5000 mg | SUBCUTANEOUS | 0 refills | Status: DC

## 2021-05-18 MED ORDER — VITAMIN D (ERGOCALCIFEROL) 1.25 MG (50000 UNIT) PO CAPS: 50000.0000 [IU] | ORAL_CAPSULE | ORAL | 0 refills | Status: DC

## 2021-05-21 NOTE — Progress Notes (Signed)
Chief Complaint:   OBESITY Jessica Frye is here to discuss her progress with her obesity treatment plan along with follow-up of her obesity related diagnoses. Jessica Frye is on keeping a food journal and adhering to recommended goals of 1200-1300 calories and 85+ protein daily and states she is following her eating plan approximately 75% of the time. Jessica Frye states she is walking 3-4 miles and going to the gym 35-40 minutes 3-5 times per week.  Today's visit was #: 11 Starting weight: 186 lbs Starting date: 09/22/2020 Today's weight: 160  lbs Today's date: 04/19/2021 Total lbs lost to date: 26  lbs Total lbs lost since last in-office visit: 4  Interim History: Jessica Frye has been doing well journaling.  She went away to [place] and did a significant amount of walking everywhere.  She I back to a normal routine now.  No upcoming trips or plans.  She does want to start incorporating different recipes in her life.  Subjective:   1. Insulin resistance Jessica Frye is on Mounjaro, 7.5 mg.  Jessica Frye denies any GI side effects.  2. Vitamin D deficiency Jessica Frye is on prescription Vitamin D.  Her last Vitamin D level was 38.2.  3. At risk for medication nonadherence Jessica Frye is at a higher than average risk for medication non-adherence due to national shortage of [place]..    Assessment/Plan:   1. Insulin resistance Jessica Frye has agreed to continue Mounjaro 7.5 mg SQ every week #2 mL no refill.  See below.  - tirzepatide (MOUNJARO) 7.5 MG/0.5ML Pen; Inject 7.5 mg into the skin once a week.  Dispense: 2 mL; Refill: 0  2. Vitamin D deficiency Jessica Frye has agreed to continue prescription Vitamin D 50,000 IU weekly #4, no refills.  See below.   - Vitamin D, Ergocalciferol, (DRISDOL) 1.25 MG (50000 UNIT) CAPS capsule; Take 1 capsule (50,000 Units total) by mouth every 7 (seven) days.  Dispense: 4 capsule; Refill: 0  3. At risk for medication nonadherence Jessica Frye was given approximately 15 minutes of counseling  today to help her avoid medication non-adherence.  We discussed importance of taking medications at a similar time each day and the use of daily pill organizers to help improve medication adherence.  Repetitive spaced learning was employed today to elicit superior memory formation and behavioral change.   4. Obesity with current BMI of 28.6 Jessica Frye has agreed to journaling. She is to start working on incorporating more fish, seafood into meal plan.   Jessica Frye is currently in the action stage of change. As such, her goal is to continue with weight loss efforts. She has agreed to keeping a food journal and adhering to recommended goals of 1200-1300 calories and 85+ protein daily.  Exercise goals: All adults should avoid inactivity. Some physical activity is better than none, and adults who participate in any amount of physical activity gain some health benefits.  Behavioral modification strategies: increasing lean protein intake, meal planning and cooking strategies, keeping healthy foods in the home, and planning for success.  Jessica Frye has agreed to follow-up with our clinic in 3 weeks. She was informed of the importance of frequent follow-up visits to maximize her success with intensive lifestyle modifications for her multiple health conditions.   Objective:   Blood pressure 121/70, pulse 72, temperature 98 F (36.7 C), height 5\' 2"  (1.575 m), weight 156 lb (70.8 kg), SpO2 98 %. Body mass index is 28.53 kg/m.  General: Cooperative, alert, well developed, in no acute distress. HEENT: Conjunctivae and lids unremarkable. Cardiovascular:  Regular rhythm.  Lungs: Normal work of breathing. Neurologic: No focal deficits.   Lab Results  Component Value Date   CREATININE 0.72 02/28/2021   BUN 9 02/28/2021   NA 141 02/28/2021   K 4.3 02/28/2021   CL 105 02/28/2021   CO2 23 02/28/2021   Lab Results  Component Value Date   ALT 11 02/28/2021   AST 13 02/28/2021   ALKPHOS 77 02/28/2021    BILITOT 0.3 02/28/2021   Lab Results  Component Value Date   HGBA1C 5.1 02/28/2021   Lab Results  Component Value Date   INSULIN 10.2 02/28/2021   INSULIN 14.9 09/22/2020   No results found for: TSH Lab Results  Component Value Date   CHOL 156 02/28/2021   HDL 41 02/28/2021   LDLCALC 104 (H) 02/28/2021   TRIG 54 02/28/2021   Lab Results  Component Value Date   VD25OH 38.2 02/28/2021   VD25OH 58.3 09/22/2020   Lab Results  Component Value Date   WBC 4.5 09/22/2020   HGB 13.2 09/22/2020   HCT 41.8 09/22/2020   MCV 89 09/22/2020   PLT 328 09/22/2020   No results found for: IRON, TIBC, FERRITIN  Attestation Statements:   Reviewed by clinician on day of visit: allergies, medications, problem list, medical history, surgical history, family history, social history, and previous encounter notes.  I, Malcolm Metro, RMA, am acting as transcriptionist for Reuben Likes, MD. I have reviewed the above documentation for accuracy and completeness, and I agree with the above. - Reuben Likes, MD

## 2021-05-23 ENCOUNTER — Telehealth (INDEPENDENT_AMBULATORY_CARE_PROVIDER_SITE_OTHER): Payer: Self-pay | Admitting: Family Medicine

## 2021-05-23 ENCOUNTER — Encounter (INDEPENDENT_AMBULATORY_CARE_PROVIDER_SITE_OTHER): Payer: Self-pay

## 2021-05-23 NOTE — Telephone Encounter (Signed)
Dr. Ukleja - Prior authorization denied for Mounjaro. Patient already uses copay card. Patient sent denial message via mychart.  

## 2021-06-08 ENCOUNTER — Ambulatory Visit (INDEPENDENT_AMBULATORY_CARE_PROVIDER_SITE_OTHER): Payer: 59 | Admitting: Family Medicine

## 2021-06-08 ENCOUNTER — Encounter (INDEPENDENT_AMBULATORY_CARE_PROVIDER_SITE_OTHER): Payer: Self-pay | Admitting: Family Medicine

## 2021-06-08 VITALS — BP 98/60 | HR 74 | Temp 98.0°F | Ht 62.0 in | Wt 157.0 lb

## 2021-06-08 DIAGNOSIS — E8881 Metabolic syndrome: Secondary | ICD-10-CM | POA: Diagnosis not present

## 2021-06-08 DIAGNOSIS — Z6828 Body mass index (BMI) 28.0-28.9, adult: Secondary | ICD-10-CM | POA: Diagnosis not present

## 2021-06-08 DIAGNOSIS — E559 Vitamin D deficiency, unspecified: Secondary | ICD-10-CM | POA: Diagnosis not present

## 2021-06-08 DIAGNOSIS — E669 Obesity, unspecified: Secondary | ICD-10-CM | POA: Diagnosis not present

## 2021-06-08 DIAGNOSIS — E88819 Insulin resistance, unspecified: Secondary | ICD-10-CM

## 2021-06-08 DIAGNOSIS — Z9189 Other specified personal risk factors, not elsewhere classified: Secondary | ICD-10-CM

## 2021-06-08 DIAGNOSIS — Z6834 Body mass index (BMI) 34.0-34.9, adult: Secondary | ICD-10-CM

## 2021-06-08 MED ORDER — TIRZEPATIDE 7.5 MG/0.5ML ~~LOC~~ SOAJ: 7.5000 mg | SUBCUTANEOUS | 0 refills | Status: DC

## 2021-06-08 MED ORDER — VITAMIN D (ERGOCALCIFEROL) 1.25 MG (50000 UNIT) PO CAPS: 50000.0000 [IU] | ORAL_CAPSULE | ORAL | 0 refills | Status: DC

## 2021-06-13 ENCOUNTER — Encounter (INDEPENDENT_AMBULATORY_CARE_PROVIDER_SITE_OTHER): Payer: Self-pay | Admitting: Family Medicine

## 2021-06-13 ENCOUNTER — Telehealth (INDEPENDENT_AMBULATORY_CARE_PROVIDER_SITE_OTHER): Payer: Self-pay | Admitting: Family Medicine

## 2021-06-13 ENCOUNTER — Encounter (INDEPENDENT_AMBULATORY_CARE_PROVIDER_SITE_OTHER): Payer: Self-pay

## 2021-06-13 NOTE — Telephone Encounter (Signed)
Dr.Ukleja 

## 2021-06-13 NOTE — Telephone Encounter (Signed)
Dr. Lawson Radar - Prior authorization denied for [facility]. Patient does not have type 2 diabetes. Patient already uses copay card. Patient sent denial message via mychart.

## 2021-06-13 NOTE — Progress Notes (Signed)
Chief Complaint:   OBESITY Jessica Frye is here to discuss her progress with her obesity treatment plan along with follow-up of her obesity related diagnoses. Jessica Frye is on keeping a food journal and adhering to recommended goals of 1200-1300 calories and 85+ grams of  protein and states she is following her eating plan approximately 90% of the time. Jessica Frye states she is walking 15 minutes 4 times per week.  Today's visit was #: 12 Starting weight: 186 lbs Starting date: 09/22/2020 Today's weight: 157 lbs Today's date: 06/08/2021 Total lbs lost to date: 29 lbs Total lbs lost since last in-office visit: 3  Interim History: Foodwise Jessica Frye has been doing really well following meal plan. She is walking at work 4 times a week. She is eating more seafood and exercising 35-40 minutes, 3-4 times a week. Calorie wise she thinks she is ending up right at same calories as previously. Not sure if [employer] Keen will still be covered.   Subjective:   1. Vitamin D deficiency Via is currently taking prescription Vit D 50,000 IU once a week. Denies any nausea, vomiting or muscle weakness. She notes fatigue.  2. Insulin resistance Jessica Frye denies any GI side effects of Mounjaro. She is taking 7.5 mg of Mounjaro currently. Notes increase in hunger.  3. At risk for deficient intake of food Jessica Frye was given approximately 15 minutes of deficient intake of food prevention counseling today. Jessica Frye is at risk for eating too few calories based on current food recall. She was encouraged to focus on meeting caloric and protein goals according to her recommended meal plan.  Assessment/Plan:   1. Vitamin D deficiency We will refill Vit D 50,000 IU once a week for 1 month with 0 refills.  -Refill Vitamin D, Ergocalciferol, (DRISDOL) 1.25 MG (50000 UNIT) CAPS capsule; Take 1 capsule (50,000 Units total) by mouth every 7 (seven) days.  Dispense: 4 capsule; Refill: 0  2. Insulin resistance We will refill Mounjaro 7.5 mg  subcutaneous once weekly for 1 month with 0 refills.  -Refill tirzepatide (MOUNJARO) 7.5 MG/0.5ML Pen; Inject 7.5 mg into the skin once a week.  Dispense: 2 mL; Refill: 0  3. At risk for deficient intake of food The patient is at a higher than average risk of deficient intake of food due to medication.  4. Obesity with current BMI of 28.7 Jessica Frye is currently in the action stage of change. As such, her goal is to continue with weight loss efforts. She has agreed to keeping a food journal and adhering to recommended goals of 1300-1400 calories and 85+ grams of protein daily.  Exercise goals: As is.  Behavioral modification strategies: increasing lean protein intake, meal planning and cooking strategies, keeping healthy foods in the home, and planning for success.  Jessica Frye has agreed to follow-up with our clinic in 5 weeks. She was informed of the importance of frequent follow-up visits to maximize her success with intensive lifestyle modifications for her multiple health conditions.   Objective:   Blood pressure 98/60, pulse 74, temperature 98 F (36.7 C), height 5\' 2"  (1.575 m), weight 157 lb (71.2 kg), SpO2 99 %. Body mass index is 28.72 kg/m.  General: Cooperative, alert, well developed, in no acute distress. HEENT: Conjunctivae and lids unremarkable. Cardiovascular: Regular rhythm.  Lungs: Normal work of breathing. Neurologic: No focal deficits.   Lab Results  Component Value Date   CREATININE 0.72 02/28/2021   BUN 9 02/28/2021   NA 141 02/28/2021   K 4.3 02/28/2021  CL 105 02/28/2021   CO2 23 02/28/2021   Lab Results  Component Value Date   ALT 11 02/28/2021   AST 13 02/28/2021   ALKPHOS 77 02/28/2021   BILITOT 0.3 02/28/2021   Lab Results  Component Value Date   HGBA1C 5.1 02/28/2021   Lab Results  Component Value Date   INSULIN 10.2 02/28/2021   INSULIN 14.9 09/22/2020   No results found for: "TSH" Lab Results  Component Value Date   CHOL 156 02/28/2021    HDL 41 02/28/2021   LDLCALC 104 (H) 02/28/2021   TRIG 54 02/28/2021   Lab Results  Component Value Date   VD25OH 38.2 02/28/2021   VD25OH 58.3 09/22/2020   Lab Results  Component Value Date   WBC 4.5 09/22/2020   HGB 13.2 09/22/2020   HCT 41.8 09/22/2020   MCV 89 09/22/2020   PLT 328 09/22/2020   No results found for: "IRON", "TIBC", "FERRITIN"  Attestation Statements:   Reviewed by clinician on day of visit: allergies, medications, problem list, medical history, surgical history, family history, social history, and previous encounter notes.  I, Fortino Sic, RMA am acting as transcriptionist for Reuben Likes, MD.  I have reviewed the above documentation for accuracy and completeness, and I agree with the above. - Reuben Likes, MD

## 2021-06-16 ENCOUNTER — Encounter (INDEPENDENT_AMBULATORY_CARE_PROVIDER_SITE_OTHER): Payer: Self-pay | Admitting: Family Medicine

## 2021-06-16 NOTE — Telephone Encounter (Signed)
Dr.Ukleja pt last seen 06/08/2021 please advise.

## 2021-06-20 ENCOUNTER — Other Ambulatory Visit (INDEPENDENT_AMBULATORY_CARE_PROVIDER_SITE_OTHER): Payer: Self-pay | Admitting: Family Medicine

## 2021-06-20 DIAGNOSIS — E669 Obesity, unspecified: Secondary | ICD-10-CM

## 2021-06-20 MED ORDER — SAXENDA 18 MG/3ML ~~LOC~~ SOPN: 3.0000 mg | PEN_INJECTOR | Freq: Every day | SUBCUTANEOUS | 0 refills | Status: DC

## 2021-06-20 MED ORDER — BD PEN NEEDLE NANO 2ND GEN 32G X 4 MM MISC: 1.0000 | Freq: Two times a day (BID) | 0 refills | Status: DC

## 2021-06-22 ENCOUNTER — Encounter (INDEPENDENT_AMBULATORY_CARE_PROVIDER_SITE_OTHER): Payer: Self-pay

## 2021-06-22 ENCOUNTER — Telehealth (INDEPENDENT_AMBULATORY_CARE_PROVIDER_SITE_OTHER): Payer: Self-pay | Admitting: Family Medicine

## 2021-06-22 NOTE — Telephone Encounter (Signed)
Dr. Lawson Radar - Prior authorization approved for Saxenda. Effective: 06/21/2021 - 10/21/2021. Patient sent approval message via mychart.

## 2021-07-06 ENCOUNTER — Encounter (INDEPENDENT_AMBULATORY_CARE_PROVIDER_SITE_OTHER): Payer: Self-pay | Admitting: Family Medicine

## 2021-07-06 ENCOUNTER — Ambulatory Visit (INDEPENDENT_AMBULATORY_CARE_PROVIDER_SITE_OTHER): Payer: 59 | Admitting: Family Medicine

## 2021-07-06 VITALS — BP 100/67 | HR 69 | Temp 98.0°F | Ht 62.0 in | Wt 154.0 lb

## 2021-07-06 DIAGNOSIS — Z6828 Body mass index (BMI) 28.0-28.9, adult: Secondary | ICD-10-CM | POA: Diagnosis not present

## 2021-07-06 DIAGNOSIS — Z6834 Body mass index (BMI) 34.0-34.9, adult: Secondary | ICD-10-CM

## 2021-07-06 DIAGNOSIS — E669 Obesity, unspecified: Secondary | ICD-10-CM | POA: Diagnosis not present

## 2021-07-06 DIAGNOSIS — E559 Vitamin D deficiency, unspecified: Secondary | ICD-10-CM | POA: Diagnosis not present

## 2021-07-06 DIAGNOSIS — E8881 Metabolic syndrome: Secondary | ICD-10-CM

## 2021-07-06 DIAGNOSIS — E88819 Insulin resistance, unspecified: Secondary | ICD-10-CM

## 2021-07-06 MED ORDER — SAXENDA 18 MG/3ML ~~LOC~~ SOPN: 3.0000 mg | PEN_INJECTOR | Freq: Every day | SUBCUTANEOUS | 0 refills | Status: DC

## 2021-07-07 NOTE — Progress Notes (Signed)
Chief Complaint:   OBESITY Jessica Frye is here to discuss her progress with her obesity treatment plan along with follow-up of her obesity related diagnoses. Jessica Frye is on keeping a food journal and adhering to recommended goals of 1300-1400 calories and 85+ grams of protein and states she is following her eating plan approximately 40% of the time. Jessica Frye states she is exercising 0 minutes 0 times per week.  Today's visit was #: 13 Starting weight: 186 lbs Starting date: 09/22/2020 Today's weight: 154 lbs Today's date: 07/06/2021 Total lbs lost to date: 32 lbs Total lbs lost since last in-office visit: 3  Interim History: Akshita recently got 2 wisdom teeth taken out 2 weeks ago. She did not get any pain medication and did not eat for days. Still anxious about eating on night side. She started [place] with no issues. She is taking 2.4 mg daily. Has not been able to get calories in due to pain.  Subjective:   1. Vitamin D deficiency Jessica Frye is currently taking prescription Vit D 50,000 IU once a week. Her last Vit D level of 38.2.  2. Insulin resistance Jessica Frye's last A1c was 5.1, insulin was 10.2. She is on GLP-1  Assessment/Plan:   1. Vitamin D deficiency Jessica Frye will continue prescription Vit D. She has labs with PCP in September.  2. Insulin resistance Jessica Frye will continue on Saxenda; labs per PCP in September.  3. Obesity with current BMI of 28.3 We will refill Saxenda 3 mg SubQ daily for 1 month with 0 refills.  -Refill Liraglutide -Weight Management (SAXENDA) 18 MG/3ML SOPN; Inject 3 mg into the skin daily.  Dispense: 15 mL; Refill: 0  Jessica Frye is currently in the action stage of change. As such, her goal is to continue with weight loss efforts. She has agreed to keeping a food journal and adhering to recommended goals of 1300-1400 calories and 85+ grams of protein daily.  Exercise goals: As is.  Behavioral modification strategies: increasing lean protein intake, meal planning  and cooking strategies, keeping healthy foods in the home, and planning for success.  Jessica Frye has agreed to follow-up with our clinic in 5 weeks. She was informed of the importance of frequent follow-up visits to maximize her success with intensive lifestyle modifications for her multiple health conditions.   Objective:   Blood pressure 100/67, pulse 69, temperature 98 F (36.7 C), height 5\' 2"  (1.575 m), weight 154 lb (69.9 kg), SpO2 100 %. Body mass index is 28.17 kg/m.  General: Cooperative, alert, well developed, in no acute distress. HEENT: Conjunctivae and lids unremarkable. Cardiovascular: Regular rhythm.  Lungs: Normal work of breathing. Neurologic: No focal deficits.   Lab Results  Component Value Date   CREATININE 0.72 02/28/2021   BUN 9 02/28/2021   NA 141 02/28/2021   K 4.3 02/28/2021   CL 105 02/28/2021   CO2 23 02/28/2021   Lab Results  Component Value Date   ALT 11 02/28/2021   AST 13 02/28/2021   ALKPHOS 77 02/28/2021   BILITOT 0.3 02/28/2021   Lab Results  Component Value Date   HGBA1C 5.1 02/28/2021   Lab Results  Component Value Date   INSULIN 10.2 02/28/2021   INSULIN 14.9 09/22/2020   No results found for: "TSH" Lab Results  Component Value Date   CHOL 156 02/28/2021   HDL 41 02/28/2021   LDLCALC 104 (H) 02/28/2021   TRIG 54 02/28/2021   Lab Results  Component Value Date   VD25OH 38.2 02/28/2021  VD25OH 58.3 09/22/2020   Lab Results  Component Value Date   WBC 4.5 09/22/2020   HGB 13.2 09/22/2020   HCT 41.8 09/22/2020   MCV 89 09/22/2020   PLT 328 09/22/2020   No results found for: "IRON", "TIBC", "FERRITIN"  Attestation Statements:   Reviewed by clinician on day of visit: allergies, medications, problem list, medical history, surgical history, family history, social history, and previous encounter notes.  I, Fortino Sic, RMA am acting as transcriptionist for Reuben Likes, MD.  I have reviewed the above documentation  for accuracy and completeness, and I agree with the above. - Reuben Likes, MD

## 2021-08-01 ENCOUNTER — Other Ambulatory Visit (INDEPENDENT_AMBULATORY_CARE_PROVIDER_SITE_OTHER): Payer: Self-pay | Admitting: Family Medicine

## 2021-08-01 DIAGNOSIS — E559 Vitamin D deficiency, unspecified: Secondary | ICD-10-CM

## 2021-08-01 DIAGNOSIS — E669 Obesity, unspecified: Secondary | ICD-10-CM

## 2021-08-09 ENCOUNTER — Ambulatory Visit (INDEPENDENT_AMBULATORY_CARE_PROVIDER_SITE_OTHER): Payer: 59 | Admitting: Family Medicine

## 2021-08-09 ENCOUNTER — Encounter (INDEPENDENT_AMBULATORY_CARE_PROVIDER_SITE_OTHER): Payer: Self-pay | Admitting: Family Medicine

## 2021-08-09 VITALS — BP 134/76 | HR 80 | Temp 98.3°F | Ht 62.0 in | Wt 161.0 lb

## 2021-08-09 DIAGNOSIS — E669 Obesity, unspecified: Secondary | ICD-10-CM | POA: Diagnosis not present

## 2021-08-09 DIAGNOSIS — Z6829 Body mass index (BMI) 29.0-29.9, adult: Secondary | ICD-10-CM

## 2021-08-09 DIAGNOSIS — Z6834 Body mass index (BMI) 34.0-34.9, adult: Secondary | ICD-10-CM

## 2021-08-09 DIAGNOSIS — E559 Vitamin D deficiency, unspecified: Secondary | ICD-10-CM

## 2021-08-09 DIAGNOSIS — E7849 Other hyperlipidemia: Secondary | ICD-10-CM

## 2021-08-09 MED ORDER — VITAMIN D (ERGOCALCIFEROL) 1.25 MG (50000 UNIT) PO CAPS: 50000.0000 [IU] | ORAL_CAPSULE | ORAL | 0 refills | Status: DC

## 2021-08-09 MED ORDER — SAXENDA 18 MG/3ML ~~LOC~~ SOPN: 3.0000 mg | PEN_INJECTOR | Freq: Every day | SUBCUTANEOUS | 0 refills | Status: DC

## 2021-08-10 ENCOUNTER — Encounter (INDEPENDENT_AMBULATORY_CARE_PROVIDER_SITE_OTHER): Payer: Self-pay

## 2021-08-11 NOTE — Progress Notes (Signed)
Chief Complaint:   OBESITY Jessica Frye is here to discuss her progress with her obesity treatment plan along with follow-up of her obesity related diagnoses. Sapir is on keeping a food journal and adhering to recommended goals of 1300-1400 calories and 85+ grams of protein and states she is following her eating plan approximately 90% of the time. Jeilyn states she is exercising 20-25 minutes 3 times per week.  Today's visit was #: 14 Starting weight: 186 lb Starting date: 09/22/2020 Today's weight: 161 lbs Today's date: 08/09/2021 Total lbs lost to date: 25 lbs Total lbs lost since last in-office visit: 0  Interim History: Jessica Frye is noticing more hunger recently. She is up to 2.5 mg on Saxenda. Hungry at work and struggling to control eating at work. Protein wise she is eating Salmon or shrimp for dinner, chicken at lunch.  Subjective:   1. Vitamin D deficiency Jessica Frye is currently taking prescription Vit D. Her last Vit D level of 38.2  2. Other hyperlipidemia Jessica Frye's last LDL at 104, HDL at 41, and Trigly at 54. She is not currently taking medication.  Assessment/Plan:   1. Vitamin D deficiency We will refill Vit D 50,000 IU once a week for 1 month with 0 refills.  -Refill Vitamin D, Ergocalciferol, (DRISDOL) 1.25 MG (50000 UNIT) CAPS capsule; Take 1 capsule (50,000 Units total) by mouth every 7 (seven) days.  Dispense: 4 capsule; Refill: 0  2. Other hyperlipidemia Labs with PCP at next available appointment.  3. Obesity with current BMI of 29.6 Increase Saxenda to 5 clicks to 2.7 mg daily times 4 days then increase to 3.0 mg daily.  -Increase/refill Liraglutide -Weight Management (SAXENDA) 18 MG/3ML SOPN; Inject 3 mg into the skin daily.  Dispense: 15 mL; Refill: 0  Jessica Frye is currently in the action stage of change. As such, her goal is to continue with weight loss efforts. She has agreed to keeping a food journal and adhering to recommended goals of 1300-1400 calories and  85+ grams of protein daily.  Exercise goals: As is.  Behavioral modification strategies: increasing lean protein intake, meal planning and cooking strategies, keeping healthy foods in the home, and planning for success.  Jessica Frye has agreed to follow-up with our clinic in 4 weeks. She was informed of the importance of frequent follow-up visits to maximize her success with intensive lifestyle modifications for her multiple health conditions.   Objective:   Blood pressure 134/76, pulse 80, temperature 98.3 F (36.8 C), height 5\' 2"  (1.575 m), weight 161 lb (73 kg), SpO2 96 %. Body mass index is 29.45 kg/m.  General: Cooperative, alert, well developed, in no acute distress. HEENT: Conjunctivae and lids unremarkable. Cardiovascular: Regular rhythm.  Lungs: Normal work of breathing. Neurologic: No focal deficits.   Lab Results  Component Value Date   CREATININE 0.72 02/28/2021   BUN 9 02/28/2021   NA 141 02/28/2021   K 4.3 02/28/2021   CL 105 02/28/2021   CO2 23 02/28/2021   Lab Results  Component Value Date   ALT 11 02/28/2021   AST 13 02/28/2021   ALKPHOS 77 02/28/2021   BILITOT 0.3 02/28/2021   Lab Results  Component Value Date   HGBA1C 5.1 02/28/2021   Lab Results  Component Value Date   INSULIN 10.2 02/28/2021   INSULIN 14.9 09/22/2020   No results found for: "TSH" Lab Results  Component Value Date   CHOL 156 02/28/2021   HDL 41 02/28/2021   LDLCALC 104 (H) 02/28/2021  TRIG 54 02/28/2021   Lab Results  Component Value Date   VD25OH 38.2 02/28/2021   VD25OH 58.3 09/22/2020   Lab Results  Component Value Date   WBC 4.5 09/22/2020   HGB 13.2 09/22/2020   HCT 41.8 09/22/2020   MCV 89 09/22/2020   PLT 328 09/22/2020   No results found for: "IRON", "TIBC", "FERRITIN"  Attestation Statements:   Reviewed by clinician on day of visit: allergies, medications, problem list, medical history, surgical history, family history, social history, and previous  encounter notes.  I, Fortino Sic, RMA am acting as transcriptionist for Reuben Likes, MD.  I have reviewed the above documentation for accuracy and completeness, and I agree with the above. - Reuben Likes, MD

## 2021-08-11 NOTE — Telephone Encounter (Signed)
error 

## 2021-08-22 NOTE — Telephone Encounter (Signed)
Please advise about protein intake message below-RX says fill 08/21/21

## 2021-08-30 ENCOUNTER — Other Ambulatory Visit: Payer: Self-pay | Admitting: Obstetrics and Gynecology

## 2021-08-30 DIAGNOSIS — Z1231 Encounter for screening mammogram for malignant neoplasm of breast: Secondary | ICD-10-CM

## 2021-09-06 ENCOUNTER — Ambulatory Visit (INDEPENDENT_AMBULATORY_CARE_PROVIDER_SITE_OTHER): Payer: 59 | Admitting: Family Medicine

## 2021-09-06 ENCOUNTER — Encounter (INDEPENDENT_AMBULATORY_CARE_PROVIDER_SITE_OTHER): Payer: Self-pay | Admitting: Family Medicine

## 2021-09-06 VITALS — BP 118/79 | HR 75 | Temp 98.3°F | Ht 62.0 in | Wt 161.0 lb

## 2021-09-06 DIAGNOSIS — E669 Obesity, unspecified: Secondary | ICD-10-CM

## 2021-09-06 DIAGNOSIS — E559 Vitamin D deficiency, unspecified: Secondary | ICD-10-CM | POA: Diagnosis not present

## 2021-09-06 DIAGNOSIS — Z6829 Body mass index (BMI) 29.0-29.9, adult: Secondary | ICD-10-CM | POA: Diagnosis not present

## 2021-09-06 DIAGNOSIS — E7849 Other hyperlipidemia: Secondary | ICD-10-CM

## 2021-09-06 MED ORDER — WEGOVY 1.7 MG/0.75ML ~~LOC~~ SOAJ: 1.7000 mg | SUBCUTANEOUS | 0 refills | Status: DC

## 2021-09-06 MED ORDER — VITAMIN D (ERGOCALCIFEROL) 1.25 MG (50000 UNIT) PO CAPS: 50000.0000 [IU] | ORAL_CAPSULE | ORAL | 0 refills | Status: DC

## 2021-09-07 ENCOUNTER — Telehealth (INDEPENDENT_AMBULATORY_CARE_PROVIDER_SITE_OTHER): Payer: Self-pay | Admitting: Family Medicine

## 2021-09-07 ENCOUNTER — Encounter (INDEPENDENT_AMBULATORY_CARE_PROVIDER_SITE_OTHER): Payer: Self-pay | Admitting: Family Medicine

## 2021-09-07 NOTE — Telephone Encounter (Signed)
Please check PA, Thanks

## 2021-09-07 NOTE — Telephone Encounter (Signed)
Dr. Lawson Radar - Prior authorization approved for [id number]. Effective: 09/07/2021 to 01/07/2022. Patient sent approval message via mychart.

## 2021-09-08 NOTE — Progress Notes (Signed)
Chief Complaint:   OBESITY Jessica Frye is here to discuss her progress with her obesity treatment plan along with follow-up of her obesity related diagnoses. Jessica Frye is on keeping a food journal and adhering to recommended goals of 1300-1400 calories and 85+ grams of protein and states she is following her eating plan approximately 80% of the time. Jessica Frye states she is exercise/walking 45/15 minutes 4-5 times per week.  Today's visit was #: 15 Starting weight: 186 lbs Starting date: 09/22/2020 Today's weight: 161 lbs Today's date: 09/06/2021 Total lbs lost to date: 25 lbs Total lbs lost since last in-office visit: 0  Interim History: Jessica Frye brings in log of all food she is eating regularly. She is always eating eggs, lean protein and vegetables for meals. Down loaded an app to try logging a different way. Not much weight difference with Saxenda.  Subjective:   1. Vitamin D deficiency Jessica Frye is currently taking prescription Vit D 50,000 IU once a week. Denies any nausea, vomiting or muscle weakness. She notes fatigue.  2. Other hyperlipidemia Jessica Frye's last LDL 104, HDL 41, Trigly 54. She is not on medications.  Assessment/Plan:   1. Vitamin D deficiency We will refill Vit D 50k IU once a week for 1 month with 0 refills.  -Refill Vitamin D, Ergocalciferol, (DRISDOL) 1.25 MG (50000 UNIT) CAPS capsule; Take 1 capsule (50,000 Units total) by mouth every 7 (seven) days.  Dispense: 4 capsule; Refill: 0  2. Other hyperlipidemia Will repeat labs with PCP early Sept.  3. Obesity with current BMI of 29.4 Stop Saxenda. Switch to [facility] 1.7 mg SubQ once a week for 1 month with 0 refills.  -Start Semaglutide-Weight Management (WEGOVY) 1.7 MG/0.75ML SOAJ; Inject 1.7 mg into the skin once a week.  Dispense: 3 mL; Refill: 0  Jessica Frye is currently in the action stage of change. As such, her goal is to continue with weight loss efforts. She has agreed to keeping a food journal and adhering to  recommended goals of 1450 calories and 85+ grams of protein daily.   Exercise goals: All adults should avoid inactivity. Some physical activity is better than none, and adults who participate in any amount of physical activity gain some health benefits.  Behavioral modification strategies: increasing lean protein intake, meal planning and cooking strategies, keeping healthy foods in the home, planning for success, and keeping a strict food journal.  Jessica Frye has agreed to follow-up with our clinic in 4 weeks. She was informed of the importance of frequent follow-up visits to maximize her success with intensive lifestyle modifications for her multiple health conditions.   Objective:   Blood pressure 118/79, pulse 75, temperature 98.3 F (36.8 C), height 5\' 2"  (1.575 m), weight 161 lb (73 kg), SpO2 100 %. Body mass index is 29.45 kg/m.  General: Cooperative, alert, well developed, in no acute distress. HEENT: Conjunctivae and lids unremarkable. Cardiovascular: Regular rhythm.  Lungs: Normal work of breathing. Neurologic: No focal deficits.   Lab Results  Component Value Date   CREATININE 0.72 02/28/2021   BUN 9 02/28/2021   NA 141 02/28/2021   K 4.3 02/28/2021   CL 105 02/28/2021   CO2 23 02/28/2021   Lab Results  Component Value Date   ALT 11 02/28/2021   AST 13 02/28/2021   ALKPHOS 77 02/28/2021   BILITOT 0.3 02/28/2021   Lab Results  Component Value Date   HGBA1C 5.1 02/28/2021   Lab Results  Component Value Date   INSULIN 10.2 02/28/2021  INSULIN 14.9 09/22/2020   No results found for: "TSH" Lab Results  Component Value Date   CHOL 156 02/28/2021   HDL 41 02/28/2021   LDLCALC 104 (H) 02/28/2021   TRIG 54 02/28/2021   Lab Results  Component Value Date   VD25OH 38.2 02/28/2021   VD25OH 58.3 09/22/2020   Lab Results  Component Value Date   WBC 4.5 09/22/2020   HGB 13.2 09/22/2020   HCT 41.8 09/22/2020   MCV 89 09/22/2020   PLT 328 09/22/2020   No  results found for: "IRON", "TIBC", "FERRITIN"  Attestation Statements:   Reviewed by clinician on day of visit: allergies, medications, problem list, medical history, surgical history, family history, social history, and previous encounter notes.  I, Fortino Sic, RMA am acting as transcriptionist for Reuben Likes, MD.  I have reviewed the above documentation for accuracy and completeness, and I agree with the above. - Reuben Likes, MD

## 2021-09-28 ENCOUNTER — Ambulatory Visit
Admission: RE | Admit: 2021-09-28 | Discharge: 2021-09-28 | Disposition: A | Discharge status: Home / Self Care | Payer: 59 | Source: Ambulatory Visit | Attending: Obstetrics and Gynecology | Admitting: Obstetrics and Gynecology

## 2021-09-28 DIAGNOSIS — Z1231 Encounter for screening mammogram for malignant neoplasm of breast: Secondary | ICD-10-CM

## 2021-10-04 ENCOUNTER — Ambulatory Visit (INDEPENDENT_AMBULATORY_CARE_PROVIDER_SITE_OTHER): Payer: 59 | Admitting: Family Medicine

## 2021-10-04 ENCOUNTER — Encounter (INDEPENDENT_AMBULATORY_CARE_PROVIDER_SITE_OTHER): Payer: Self-pay | Admitting: Family Medicine

## 2021-10-04 VITALS — BP 99/70 | HR 70 | Temp 98.4°F | Ht 62.0 in | Wt 154.0 lb

## 2021-10-04 DIAGNOSIS — Z6834 Body mass index (BMI) 34.0-34.9, adult: Secondary | ICD-10-CM

## 2021-10-04 DIAGNOSIS — Z6829 Body mass index (BMI) 29.0-29.9, adult: Secondary | ICD-10-CM | POA: Diagnosis not present

## 2021-10-04 DIAGNOSIS — E669 Obesity, unspecified: Secondary | ICD-10-CM

## 2021-10-04 DIAGNOSIS — E559 Vitamin D deficiency, unspecified: Secondary | ICD-10-CM

## 2021-10-04 MED ORDER — WEGOVY 1.7 MG/0.75ML ~~LOC~~ SOAJ
1.7000 mg | SUBCUTANEOUS | 0 refills | Status: DC
  Filled 2021-12-01: qty 3, 28d supply, fill #0

## 2021-10-05 NOTE — Progress Notes (Signed)
Chief Complaint:   OBESITY Aubery is here to discuss her progress with her obesity treatment plan along with follow-up of her obesity related diagnoses. Mileidy is on keeping a food journal and adhering to recommended goals of 1450 calories and 85+ grams of protein and states she is following her eating plan approximately 90-95% of the time. Bronwen states she is exercising and walking 4 times per week.  Today's visit was #: 38 Starting weight: 186 lbs Starting date: 09/22/2020 Today's weight: 154 lbs Today's date: 10/04/2021 Total lbs lost to date: 32 lbs Total lbs lost since last in-office visit: 7  Interim History: Lycia has stuck really consistently to meal plan over the last few weeks. Sticking around 1450 calories and has increased her protein amount daily. Still doing a good amount of exercise as well. No upcoming plans until Thanksgiving.  Subjective:   1. Vitamin D deficiency Ellisha is currently taking prescription Vit D 50,000 IU once a week. Denies any nausea, vomiting or muscle weakness. She notes fatigue.  Assessment/Plan:   1. Vitamin D deficiency Continue Vit D without any changes in dose.  2. Obesity with current BMI of 29.4 We will refill Wegovy 1.7 mg once a week for 1 month with 0 refills.  -Refill Semaglutide-Weight Management (WEGOVY) 1.7 MG/0.75ML SOAJ; Inject 1.7 mg into the skin once a week.  Dispense: 9 mL; Refill: 0  Lataysha is currently in the action stage of change. As such, her goal is to continue with weight loss efforts. She has agreed to keeping a food journal and adhering to recommended goals of 1450 calories and 85+ grams of protein daily.   Exercise goals: As is. Discussed the importance of isometric resistance training.   Behavioral modification strategies: increasing lean protein intake, meal planning and cooking strategies, keeping healthy foods in the home, planning for success, and keeping a strict food journal.  Suzetta has agreed to  follow-up with our clinic in 4 weeks. She was informed of the importance of frequent follow-up visits to maximize her success with intensive lifestyle modifications for her multiple health conditions.   Objective:   Blood pressure 99/70, pulse 70, temperature 98.4 F (36.9 C), height 5\' 2"  (1.575 m), weight 154 lb (69.9 kg), SpO2 100 %. Body mass index is 28.17 kg/m.  General: Cooperative, alert, well developed, in no acute distress. HEENT: Conjunctivae and lids unremarkable. Cardiovascular: Regular rhythm.  Lungs: Normal work of breathing. Neurologic: No focal deficits.   Lab Results  Component Value Date   CREATININE 0.72 02/28/2021   BUN 9 02/28/2021   NA 141 02/28/2021   K 4.3 02/28/2021   CL 105 02/28/2021   CO2 23 02/28/2021   Lab Results  Component Value Date   ALT 11 02/28/2021   AST 13 02/28/2021   ALKPHOS 77 02/28/2021   BILITOT 0.3 02/28/2021   Lab Results  Component Value Date   HGBA1C 5.1 02/28/2021   Lab Results  Component Value Date   INSULIN 10.2 02/28/2021   INSULIN 14.9 09/22/2020   No results found for: "TSH" Lab Results  Component Value Date   CHOL 156 02/28/2021   HDL 41 02/28/2021   LDLCALC 104 (H) 02/28/2021   TRIG 54 02/28/2021   Lab Results  Component Value Date   VD25OH 38.2 02/28/2021   VD25OH 58.3 09/22/2020   Lab Results  Component Value Date   WBC 4.5 09/22/2020   HGB 13.2 09/22/2020   HCT 41.8 09/22/2020   MCV 89 09/22/2020  PLT 328 09/22/2020   No results found for: "IRON", "TIBC", "FERRITIN"  Attestation Statements:   Reviewed by clinician on day of visit: allergies, medications, problem list, medical history, surgical history, family history, social history, and previous encounter notes.  I, Fortino Sic, RMA am acting as transcriptionist for Reuben Likes, MD.  I have reviewed the above documentation for accuracy and completeness, and I agree with the above. - Reuben Likes, MD

## 2021-11-07 ENCOUNTER — Ambulatory Visit (INDEPENDENT_AMBULATORY_CARE_PROVIDER_SITE_OTHER): Payer: 59 | Admitting: Family Medicine

## 2021-11-07 ENCOUNTER — Encounter (INDEPENDENT_AMBULATORY_CARE_PROVIDER_SITE_OTHER): Payer: Self-pay | Admitting: Family Medicine

## 2021-11-07 VITALS — BP 123/75 | HR 71 | Temp 98.6°F | Ht 62.0 in | Wt 149.0 lb

## 2021-11-07 DIAGNOSIS — E7849 Other hyperlipidemia: Secondary | ICD-10-CM

## 2021-11-07 DIAGNOSIS — E559 Vitamin D deficiency, unspecified: Secondary | ICD-10-CM | POA: Diagnosis not present

## 2021-11-07 DIAGNOSIS — E669 Obesity, unspecified: Secondary | ICD-10-CM

## 2021-11-07 DIAGNOSIS — Z6827 Body mass index (BMI) 27.0-27.9, adult: Secondary | ICD-10-CM | POA: Diagnosis not present

## 2021-11-07 MED ORDER — VITAMIN D (ERGOCALCIFEROL) 1.25 MG (50000 UNIT) PO CAPS: 50000.0000 [IU] | ORAL_CAPSULE | ORAL | 0 refills | Status: DC

## 2021-11-14 NOTE — Progress Notes (Signed)
Chief Complaint:   OBESITY Jessica Frye is here to discuss her progress with her obesity treatment plan along with follow-up of her obesity related diagnoses. Jessica Frye is on keeping a food journal and adhering to recommended goals of 1450 calories and 85+ grams of protein and states she is following her eating plan approximately 95% of the time. Jessica Frye states she is going to the gym/walking 45/30 minutes 3/1 times per week.  Today's visit was #: 17 Starting weight: 186 lbs Starting date: 09/22/2020 Today's weight: 149 lbs Today's date: 11/07/2021 Total lbs lost to date: 37 lbs Total lbs lost since last in-office visit: 5  Interim History: Jessica Frye has been writing all food choices down. She is doing 3-8 oz of protein every time she eats. She is a habitual eater. Going to [facility] for Thanksgiving; staying home for Christmas. Still doing gym 3-4 times a week and walking sometimes too.  Subjective:   1. Vitamin D deficiency Thomasenia is currently taking prescription Vit D 50,000 IU once a week.  Her last Vit D level of 38.2.  2. Other hyperlipidemia Jessica Frye is not on medications. LDL minimal elevated at 104.  Assessment/Plan:   1. Vitamin D deficiency We will refill Vit D 50k IU once a week for 1 month with 0 refills.  -Refill Vitamin D, Ergocalciferol, (DRISDOL) 1.25 MG (50000 UNIT) CAPS capsule; Take 1 capsule (50,000 Units total) by mouth every 7 (seven) days.  Dispense: 4 capsule; Refill: 0  2. Other hyperlipidemia Will obtain labs in 6 months.  3. Obesity with current BMI of 27.3 [name] is currently in the action stage of change. As such, her goal is to continue with weight loss efforts. She has agreed to keeping a food journal and adhering to recommended goals of 1450 calories and 85+ grams of protein daily.   Exercise goals: All adults should avoid inactivity. Some physical activity is better than none, and adults who participate in any amount of physical activity gain some health  benefits.  Behavioral modification strategies: increasing lean protein intake, meal planning and cooking strategies, keeping healthy foods in the home, planning for success, and keeping a strict food journal.  Jessica Frye has agreed to follow-up with our clinic in 8 weeks. She was informed of the importance of frequent follow-up visits to maximize her success with intensive lifestyle modifications for her multiple health conditions.   Objective:   Blood pressure 123/75, pulse 71, temperature 98.6 F (37 C), height 5\' 2"  (1.575 m), weight 149 lb (67.6 kg), SpO2 98 %. Body mass index is 27.25 kg/m.  General: Cooperative, alert, well developed, in no acute distress. HEENT: Conjunctivae and lids unremarkable. Cardiovascular: Regular rhythm.  Lungs: Normal work of breathing. Neurologic: No focal deficits.   Lab Results  Component Value Date   CREATININE 0.72 02/28/2021   BUN 9 02/28/2021   NA 141 02/28/2021   K 4.3 02/28/2021   CL 105 02/28/2021   CO2 23 02/28/2021   Lab Results  Component Value Date   ALT 11 02/28/2021   AST 13 02/28/2021   ALKPHOS 77 02/28/2021   BILITOT 0.3 02/28/2021   Lab Results  Component Value Date   HGBA1C 5.1 02/28/2021   Lab Results  Component Value Date   INSULIN 10.2 02/28/2021   INSULIN 14.9 09/22/2020   No results found for: "TSH" Lab Results  Component Value Date   CHOL 156 02/28/2021   HDL 41 02/28/2021   LDLCALC 104 (H) 02/28/2021   TRIG 54 02/28/2021  Lab Results  Component Value Date   VD25OH 38.2 02/28/2021   VD25OH 58.3 09/22/2020   Lab Results  Component Value Date   WBC 4.5 09/22/2020   HGB 13.2 09/22/2020   HCT 41.8 09/22/2020   MCV 89 09/22/2020   PLT 328 09/22/2020   No results found for: "IRON", "TIBC", "FERRITIN"  Attestation Statements:   Reviewed by clinician on day of visit: allergies, medications, problem list, medical history, surgical history, family history, social history, and previous encounter  notes.  I, Fortino Sic, RMA am acting as transcriptionist for Reuben Likes, MD.  I have reviewed the above documentation for accuracy and completeness, and I agree with the above. - Reuben Likes, MD

## 2021-11-30 ENCOUNTER — Encounter (INDEPENDENT_AMBULATORY_CARE_PROVIDER_SITE_OTHER): Payer: Self-pay | Admitting: Family Medicine

## 2021-11-30 NOTE — Telephone Encounter (Signed)
Please advise 

## 2021-12-01 ENCOUNTER — Other Ambulatory Visit (HOSPITAL_COMMUNITY): Payer: Self-pay

## 2021-12-02 ENCOUNTER — Other Ambulatory Visit (HOSPITAL_COMMUNITY): Payer: Self-pay

## 2022-01-03 ENCOUNTER — Ambulatory Visit (INDEPENDENT_AMBULATORY_CARE_PROVIDER_SITE_OTHER): Payer: 59 | Admitting: Family Medicine

## 2022-01-03 ENCOUNTER — Encounter (INDEPENDENT_AMBULATORY_CARE_PROVIDER_SITE_OTHER): Payer: Self-pay | Admitting: Family Medicine

## 2022-01-03 VITALS — BP 110/73 | HR 72 | Temp 98.1°F | Ht 62.0 in | Wt 142.0 lb

## 2022-01-03 DIAGNOSIS — E559 Vitamin D deficiency, unspecified: Secondary | ICD-10-CM

## 2022-01-03 DIAGNOSIS — E669 Obesity, unspecified: Secondary | ICD-10-CM | POA: Diagnosis not present

## 2022-01-03 DIAGNOSIS — Z6834 Body mass index (BMI) 34.0-34.9, adult: Secondary | ICD-10-CM

## 2022-01-03 DIAGNOSIS — Z6826 Body mass index (BMI) 26.0-26.9, adult: Secondary | ICD-10-CM

## 2022-01-03 DIAGNOSIS — E7849 Other hyperlipidemia: Secondary | ICD-10-CM

## 2022-01-03 MED ORDER — WEGOVY 1.7 MG/0.75ML ~~LOC~~ SOAJ: 1.7000 mg | SUBCUTANEOUS | 0 refills | Status: DC

## 2022-01-03 MED ORDER — VITAMIN D (ERGOCALCIFEROL) 1.25 MG (50000 UNIT) PO CAPS: 50000.0000 [IU] | ORAL_CAPSULE | ORAL | 0 refills | Status: DC

## 2022-01-04 ENCOUNTER — Encounter (INDEPENDENT_AMBULATORY_CARE_PROVIDER_SITE_OTHER): Payer: Self-pay | Admitting: Family Medicine

## 2022-01-04 NOTE — Telephone Encounter (Signed)
Spoke w/ pt, checking with [facility], and [facility] for 1.7 or 1.0mg 

## 2022-01-09 MED ORDER — ZEPBOUND 7.5 MG/0.5ML ~~LOC~~ SOAJ: 7.5000 mg | SUBCUTANEOUS | 0 refills | Status: DC

## 2022-01-09 NOTE — Telephone Encounter (Signed)
Will work on PA

## 2022-01-10 NOTE — Telephone Encounter (Signed)
Zepbound is 550$, pt will check pharmacies to see if Mancel Parsons comes available

## 2022-01-13 NOTE — Progress Notes (Signed)
Chief Complaint:   OBESITY Jessica Frye is here to discuss her progress with her obesity treatment plan along with follow-up of her obesity related diagnoses. Jessica Frye is on keeping a food journal and adhering to recommended goals of 1450 calories and 85+ grams of protein and states she is following her eating plan approximately 80% of the time. Ameris states she is exercising 0 minutes 0 times per week.  Today's visit was #: 84 Starting weight: 186 lbs Starting date: 09/22/2020 Today's weight: 142 lbs Today's date: 01/03/2022 Total lbs lost to date: 44 lbs Total lbs lost since last in-office visit: 7  Interim History: Keierra has been sick for 10 days over the holiday.  Stayed local for Christmas and was unfortunately not feeling well.  Has not been working out due to the holiday season and did not feel well.  No upcoming plan.  Last ICD was September 2022.  Subjective:   1. Vitamin D deficiency Jessica Frye is currently taking prescription Vit D 50,000 IU once a week. Denies any nausea, vomiting or muscle weakness. She notes fatigue.  Last Vit D was below goal.  2. Other hyperlipidemia Jessica Frye's last LDL minimally elevated.  She is not on medications.  Assessment/Plan:   1. Vitamin D deficiency We will refill Vit D 50K IU once a week for 1 month with 0 refills.  -Refill Vitamin D, Ergocalciferol, (DRISDOL) 1.25 MG (50000 UNIT) CAPS capsule; Take 1 capsule (50,000 Units total) by mouth every 7 (seven) days.  Dispense: 4 capsule; Refill: 0  2. Other hyperlipidemia Will obtain labs at next appointment.  3. Obesity with current BMI of 26.0 We will send Zepbound 7.5 mg SubQ once a week for 1 month with 0 refills.  -Start tirzepatide (ZEPBOUND) 7.5 MG/0.5ML Pen; Inject 7.5 mg into the skin once a week.  Dispense: 2 mL; Refill: 0  Jessica Frye is currently in the action stage of change. As such, her goal is to continue with weight loss efforts. She has agreed to keeping a food journal and adhering  to recommended goals of 1450 calories and 85+ grams of protein daily.   Exercise goals: As is. Get back into resistance training.  Behavioral modification strategies: increasing lean protein intake, meal planning and cooking strategies, keeping healthy foods in the home, planning for success, and keeping a strict food journal.  Jessica Frye has agreed to follow-up with our clinic in 6 weeks. She was informed of the importance of frequent follow-up visits to maximize her success with intensive lifestyle modifications for her multiple health conditions.   Objective:   Blood pressure 110/73, pulse 72, temperature 98.1 F (36.7 C), height 5\' 2"  (1.575 m), weight 142 lb (64.4 kg), SpO2 100 %. Body mass index is 25.97 kg/m.  General: Cooperative, alert, well developed, in no acute distress. HEENT: Conjunctivae and lids unremarkable. Cardiovascular: Regular rhythm.  Lungs: Normal work of breathing. Neurologic: No focal deficits.   Lab Results  Component Value Date   CREATININE 0.72 02/28/2021   BUN 9 02/28/2021   NA 141 02/28/2021   K 4.3 02/28/2021   CL 105 02/28/2021   CO2 23 02/28/2021   Lab Results  Component Value Date   ALT 11 02/28/2021   AST 13 02/28/2021   ALKPHOS 77 02/28/2021   BILITOT 0.3 02/28/2021   Lab Results  Component Value Date   HGBA1C 5.1 02/28/2021   Lab Results  Component Value Date   INSULIN 10.2 02/28/2021   INSULIN 14.9 09/22/2020   No results  found for: "TSH" Lab Results  Component Value Date   CHOL 156 02/28/2021   HDL 41 02/28/2021   LDLCALC 104 (H) 02/28/2021   TRIG 54 02/28/2021   Lab Results  Component Value Date   VD25OH 38.2 02/28/2021   VD25OH 58.3 09/22/2020   Lab Results  Component Value Date   WBC 4.5 09/22/2020   HGB 13.2 09/22/2020   HCT 41.8 09/22/2020   MCV 89 09/22/2020   PLT 328 09/22/2020   No results found for: "IRON", "TIBC", "FERRITIN"  Attestation Statements:   Reviewed by clinician on day of visit: allergies,  medications, problem list, medical history, surgical history, family history, social history, and previous encounter notes.  I, Elnora Morrison, RMA am acting as transcriptionist for Coralie Common, MD.  I have reviewed the above documentation for accuracy and completeness, and I agree with the above. - Coralie Common, MD

## 2022-01-23 ENCOUNTER — Other Ambulatory Visit (INDEPENDENT_AMBULATORY_CARE_PROVIDER_SITE_OTHER): Payer: Self-pay | Admitting: Family Medicine

## 2022-01-23 MED ORDER — ONDANSETRON 4 MG PO TBDP: 4.0000 mg | ORAL_TABLET | Freq: Three times a day (TID) | ORAL | 0 refills | Status: DC | PRN

## 2022-01-23 NOTE — Telephone Encounter (Signed)
Please advise 

## 2022-02-16 ENCOUNTER — Encounter (INDEPENDENT_AMBULATORY_CARE_PROVIDER_SITE_OTHER): Payer: Self-pay | Admitting: Family Medicine

## 2022-02-16 ENCOUNTER — Ambulatory Visit (INDEPENDENT_AMBULATORY_CARE_PROVIDER_SITE_OTHER): Payer: 59 | Admitting: Family Medicine

## 2022-02-16 VITALS — BP 108/68 | HR 78 | Temp 98.6°F | Ht 62.0 in | Wt 143.0 lb

## 2022-02-16 DIAGNOSIS — E669 Obesity, unspecified: Secondary | ICD-10-CM | POA: Diagnosis not present

## 2022-02-16 DIAGNOSIS — E88819 Insulin resistance, unspecified: Secondary | ICD-10-CM

## 2022-02-16 DIAGNOSIS — Z6826 Body mass index (BMI) 26.0-26.9, adult: Secondary | ICD-10-CM

## 2022-02-16 DIAGNOSIS — E559 Vitamin D deficiency, unspecified: Secondary | ICD-10-CM | POA: Diagnosis not present

## 2022-02-16 DIAGNOSIS — E7849 Other hyperlipidemia: Secondary | ICD-10-CM

## 2022-02-16 NOTE — Progress Notes (Deleted)
Patient had to wait to get the [facility] after last appointment due to supply issues.  Most recent refill was on February 8th.  January 18th she got her refill but went thru the holidays without it. Still doing really on journaling and staying around 1450 and 85g of protein daily.  Her gym was getting a remodel and she was ill so she missed around 2 weeks but otherwise she is now back to the 44mn 3x a week.  Now she is up to 4 days consistently.  Her sinuses are still congested.  Next few weeks she is back to her normal schedule of work and weights.

## 2022-02-17 LAB — LIPID PANEL WITH LDL/HDL RATIO
Cholesterol, Total: 185 mg/dL (ref 100–199)
HDL: 60 mg/dL (ref >39)
LDL Chol Calc (NIH): 116 mg/dL — ABNORMAL HIGH (ref 0–99)
LDL/HDL Ratio: 1.9 ratio (ref 0.0–3.2)
Triglycerides: 45 mg/dL (ref 0–149)
VLDL Cholesterol Cal: 9 mg/dL (ref 5–40)

## 2022-02-17 LAB — COMPREHENSIVE METABOLIC PANEL
ALT: 12 IU/L (ref 0–32)
AST: 12 IU/L (ref 0–40)
Albumin/Globulin Ratio: 1.8 (ref 1.2–2.2)
Albumin: 4.3 g/dL (ref 3.9–4.9)
Alkaline Phosphatase: 70 IU/L (ref 44–121)
BUN/Creatinine Ratio: 16 (ref 9–23)
BUN: 11 mg/dL (ref 6–24)
Bilirubin Total: 0.3 mg/dL (ref 0.0–1.2)
CO2: 23 mmol/L (ref 20–29)
Calcium: 10 mg/dL (ref 8.7–10.2)
Chloride: 104 mmol/L (ref 96–106)
Creatinine, Ser: 0.69 mg/dL (ref 0.57–1.00)
Globulin, Total: 2.4 g/dL (ref 1.5–4.5)
Glucose: 76 mg/dL (ref 70–99)
Potassium: 4.6 mmol/L (ref 3.5–5.2)
Sodium: 140 mmol/L (ref 134–144)
Total Protein: 6.7 g/dL (ref 6.0–8.5)
eGFR: 108 mL/min/{1.73_m2} (ref >59)

## 2022-02-17 LAB — HEMOGLOBIN A1C
Est. average glucose Bld gHb Est-mCnc: 94 mg/dL
Hgb A1c MFr Bld: 4.9 % (ref 4.8–5.6)

## 2022-02-17 LAB — VITAMIN D 25 HYDROXY (VIT D DEFICIENCY, FRACTURES): Vit D, 25-Hydroxy: 54.4 ng/mL (ref 30.0–100.0)

## 2022-02-17 LAB — INSULIN, RANDOM: INSULIN: 5.4 u[IU]/mL (ref 2.6–24.9)

## 2022-02-27 NOTE — Progress Notes (Signed)
Chief Complaint:   OBESITY Jessica Frye is here to discuss her progress with her obesity treatment plan along with follow-up of her obesity related diagnoses. Jessica Frye is on keeping a food journal and adhering to recommended goals of 1450 calories and 85+ grams of protein and states she is following her eating plan approximately 90% of the time. Jessica Frye states she is exercising 35 minutes 3 times per week.  Today's visit was #: 46 Starting weight: 186 lbs Starting date: 09/22/2020 Today's weight: 143 lbs Today's date: 02/16/2022 Total lbs lost to date: 43 lbs Total lbs lost since last in-office visit: 0  Interim History: Jessica Frye had to wait to get the [facility] after last appointment due to supply issues.  Most recent refill was on February 8th.  January 18th she got her refill but went thru the holidays without it. Still doing really on journaling and staying around 1450 and 85g of protein daily.  Her gym was getting a remodel and she was ill so she missed around 2 weeks but otherwise she is now back to the 29mn 3x a week.  Now she is up to 4 days consistently.  Her sinuses are still congested.  Next few weeks she is back to her normal schedule of work and weights.    Subjective:   1. Other hyperlipidemia Last LDL slightly elevated.  Not on medications.  2. Vitamin D deficiency Jessica Frye is currently taking prescription Vit D 50,000 IU once a week.  Last level still not at goal.  3. Insulin resistance On GLP_1.  Minimal carb cravings.  Assessment/Plan:   1. Other hyperlipidemia We will obtain labs today.  - Lipid Panel With LDL/HDL Ratio  2. Vitamin D deficiency We will obtain labs today.  - VITAMIN D 25 Hydroxy (Vit-D Deficiency, Fractures)  3. Insulin resistance We will obtain labs today.  - Comprehensive metabolic panel - Hemoglobin A1c - Insulin, random  4. Obesity with current BMI of 26.3 Jessica Frye is currently in the action stage of change. As such, her goal is to continue  with weight loss efforts. She has agreed to keeping a food journal and adhering to recommended goals of 1450 calories and 85+ grams of protein daily.   Exercise goals: As is.  Behavioral modification strategies: increasing lean protein intake, meal planning and cooking strategies, keeping healthy foods in the home, and planning for success.  Jessica Frye has agreed to follow-up with our clinic in 7 weeks. She was informed of the importance of frequent follow-up visits to maximize her success with intensive lifestyle modifications for her multiple health conditions.   Jessica Frye was informed we would discuss her lab results at her next visit unless there is a critical issue that needs to be addressed sooner. Jessica Frye agreed to keep her next visit at the agreed upon time to discuss these results.  Objective:   Blood pressure 108/68, pulse 78, temperature 98.6 F (37 C), height '5\' 2"'$  (1.575 m), weight 143 lb (64.9 kg), SpO2 100 %. Body mass index is 26.16 kg/m.  General: Cooperative, alert, well developed, in no acute distress. HEENT: Conjunctivae and lids unremarkable. Cardiovascular: Regular rhythm.  Lungs: Normal work of breathing. Neurologic: No focal deficits.   Lab Results  Component Value Date   CREATININE 0.69 02/16/2022   BUN 11 02/16/2022   NA 140 02/16/2022   K 4.6 02/16/2022   CL 104 02/16/2022   CO2 23 02/16/2022   Lab Results  Component Value Date   ALT 12 02/16/2022  AST 12 02/16/2022   ALKPHOS 70 02/16/2022   BILITOT 0.3 02/16/2022   Lab Results  Component Value Date   HGBA1C 4.9 02/16/2022   HGBA1C 5.1 02/28/2021   Lab Results  Component Value Date   INSULIN 5.4 02/16/2022   INSULIN 10.2 02/28/2021   INSULIN 14.9 09/22/2020   No results found for: "TSH" Lab Results  Component Value Date   CHOL 185 02/16/2022   HDL 60 02/16/2022   LDLCALC 116 (H) 02/16/2022   TRIG 45 02/16/2022   Lab Results  Component Value Date   VD25OH 54.4 02/16/2022   VD25OH 38.2  02/28/2021   VD25OH 58.3 09/22/2020   Lab Results  Component Value Date   WBC 4.5 09/22/2020   HGB 13.2 09/22/2020   HCT 41.8 09/22/2020   MCV 89 09/22/2020   PLT 328 09/22/2020   No results found for: "IRON", "TIBC", "FERRITIN"  Attestation Statements:   Reviewed by clinician on day of visit: allergies, medications, problem list, medical history, surgical history, family history, social history, and previous encounter notes.  I, Elnora Morrison, RMA am acting as transcriptionist for Coralie Common, MD.  I have reviewed the above documentation for accuracy and completeness, and I agree with the above. - Coralie Common, MD

## 2022-04-05 ENCOUNTER — Other Ambulatory Visit (INDEPENDENT_AMBULATORY_CARE_PROVIDER_SITE_OTHER): Payer: Self-pay | Admitting: Family Medicine

## 2022-04-10 ENCOUNTER — Ambulatory Visit (INDEPENDENT_AMBULATORY_CARE_PROVIDER_SITE_OTHER): Payer: 59 | Admitting: Family Medicine

## 2022-04-10 ENCOUNTER — Encounter (INDEPENDENT_AMBULATORY_CARE_PROVIDER_SITE_OTHER): Payer: Self-pay | Admitting: Family Medicine

## 2022-04-10 ENCOUNTER — Encounter (INDEPENDENT_AMBULATORY_CARE_PROVIDER_SITE_OTHER): Payer: Self-pay

## 2022-04-10 VITALS — BP 103/69 | HR 77 | Temp 98.1°F | Ht 62.0 in | Wt 142.0 lb

## 2022-04-10 DIAGNOSIS — Z6826 Body mass index (BMI) 26.0-26.9, adult: Secondary | ICD-10-CM | POA: Diagnosis not present

## 2022-04-10 DIAGNOSIS — E7849 Other hyperlipidemia: Secondary | ICD-10-CM | POA: Diagnosis not present

## 2022-04-10 DIAGNOSIS — E669 Obesity, unspecified: Secondary | ICD-10-CM | POA: Diagnosis not present

## 2022-04-10 DIAGNOSIS — E559 Vitamin D deficiency, unspecified: Secondary | ICD-10-CM

## 2022-04-10 MED ORDER — WEGOVY 1.7 MG/0.75ML ~~LOC~~ SOAJ: 1.7000 mg | SUBCUTANEOUS | 0 refills | Status: DC

## 2022-04-10 MED ORDER — VITAMIN D (ERGOCALCIFEROL) 1.25 MG (50000 UNIT) PO CAPS: 50000.0000 [IU] | ORAL_CAPSULE | ORAL | 0 refills | Status: DC

## 2022-04-10 NOTE — Progress Notes (Unsigned)
Chief Complaint:   OBESITY Jessica Frye is here to discuss her progress with her obesity treatment plan along with follow-up of her obesity related diagnoses. Jessica Frye is on keeping a food journal and adhering to recommended goals of 1450 calories and 85+ protein and states she is following her eating plan approximately 90% of the time. Jessica Frye states she is exercising for 35 to 40 minutes 3 times per week.  Today's visit was #: 20 Starting weight: 186 LBS Starting date: 09/22/2020  Today's weight: 142 LBS Today's date: 04/10/2022 Total lbs lost to date: 44 LBS Total lbs lost since last in-office visit: 1 LB  Interim History: Since last appointment she has been working and relaxing.  She doesn't have any injections left of either Wegovy or Zepbound.  Her last prescription pick up was in January.  She voices that she has been exercising consistently since last appointment.  She is going to the gym 3-4 times a week.  She is walking at work with a few coworkers. She may go to the [place] or [place] in May.   Subjective:   1. Vitamin D deficiency Patient is on prescription vitamin D.  Patient denies nausea, vomiting, muscle weakness, but is positive for fatigue.  Last vitamin D level was 54.4.  2. Other hyperlipidemia Patient last LDL slightly elevated at 116.  Patient is not taking any medication.  Assessment/Plan:   1. Vitamin D deficiency Refill- Vitamin D, Ergocalciferol, (DRISDOL) 1.25 MG (50000 UNIT) CAPS capsule; Take 1 capsule (50,000 Units total) by mouth every 7 (seven) days.  Dispense: 4 capsule; Refill: 0  2. Other hyperlipidemia Repeat labs in July.  3. Obesity with current BMI of 26.1 Refill- Semaglutide-Weight Management (WEGOVY) 1.7 MG/0.75ML SOAJ; Inject 1.7 mg into the skin once a week.  Dispense: 9 mL; Refill: 0  Jessica Frye is currently in the action stage of change. As such, her goal is to continue with weight loss efforts. She has agreed to keeping a food journal and adhering  to recommended goals of 1450 calories and 85+ protein daily.   Exercise goals:  Patient to increase resistance training with a goal of 300 minutes/week of moderate/vigorous activity.  Behavioral modification strategies: increasing lean protein intake, meal planning and cooking strategies, keeping healthy foods in the home, and planning for success.  [name] has agreed to follow-up with our clinic in 4 weeks. She was informed of the importance of frequent follow-up visits to maximize her success with intensive lifestyle modifications for her multiple health conditions.   Objective:   Blood pressure 103/69, pulse 77, temperature 98.1 F (36.7 C), height 5\' 2"  (1.575 m), weight 142 lb (64.4 kg), SpO2 100 %. Body mass index is 25.97 kg/m.  General: Cooperative, alert, well developed, in no acute distress. HEENT: Conjunctivae and lids unremarkable. Cardiovascular: Regular rhythm.  Lungs: Normal work of breathing. Neurologic: No focal deficits.   Lab Results  Component Value Date   CREATININE 0.69 02/16/2022   BUN 11 02/16/2022   NA 140 02/16/2022   K 4.6 02/16/2022   CL 104 02/16/2022   CO2 23 02/16/2022   Lab Results  Component Value Date   ALT 12 02/16/2022   AST 12 02/16/2022   ALKPHOS 70 02/16/2022   BILITOT 0.3 02/16/2022   Lab Results  Component Value Date   HGBA1C 4.9 02/16/2022   HGBA1C 5.1 02/28/2021   Lab Results  Component Value Date   INSULIN 5.4 02/16/2022   INSULIN 10.2 02/28/2021   INSULIN 14.9  09/22/2020   No results found for: "TSH" Lab Results  Component Value Date   CHOL 185 02/16/2022   HDL 60 02/16/2022   LDLCALC 116 (H) 02/16/2022   TRIG 45 02/16/2022   Lab Results  Component Value Date   VD25OH 54.4 02/16/2022   VD25OH 38.2 02/28/2021   VD25OH 58.3 09/22/2020   Lab Results  Component Value Date   WBC 4.5 09/22/2020   HGB 13.2 09/22/2020   HCT 41.8 09/22/2020   MCV 89 09/22/2020   PLT 328 09/22/2020   No results found for: "IRON",  "TIBC", "FERRITIN"  Attestation Statements:   Reviewed by clinician on day of visit: allergies, medications, problem list, medical history, surgical history, family history, social history, and previous encounter notes.  I, Malcolm Metro, RMA, am acting as transcriptionist for Reuben Likes, MD.  I have reviewed the above documentation for accuracy and completeness, and I agree with the above. - Reuben Likes, MD

## 2022-05-08 ENCOUNTER — Ambulatory Visit (INDEPENDENT_AMBULATORY_CARE_PROVIDER_SITE_OTHER): Payer: 59 | Admitting: Family Medicine

## 2022-05-10 ENCOUNTER — Encounter (INDEPENDENT_AMBULATORY_CARE_PROVIDER_SITE_OTHER): Payer: Self-pay | Admitting: Family Medicine

## 2022-05-10 ENCOUNTER — Ambulatory Visit (INDEPENDENT_AMBULATORY_CARE_PROVIDER_SITE_OTHER): Payer: 59 | Admitting: Family Medicine

## 2022-05-10 VITALS — BP 118/74 | HR 67 | Temp 98.6°F | Ht 62.0 in | Wt 146.0 lb

## 2022-05-10 DIAGNOSIS — E559 Vitamin D deficiency, unspecified: Secondary | ICD-10-CM | POA: Diagnosis not present

## 2022-05-10 DIAGNOSIS — E669 Obesity, unspecified: Secondary | ICD-10-CM

## 2022-05-10 DIAGNOSIS — Z6826 Body mass index (BMI) 26.0-26.9, adult: Secondary | ICD-10-CM

## 2022-05-10 MED ORDER — VITAMIN D (ERGOCALCIFEROL) 1.25 MG (50000 UNIT) PO CAPS: 50000.0000 [IU] | ORAL_CAPSULE | ORAL | 0 refills | Status: DC

## 2022-05-10 NOTE — Progress Notes (Signed)
Chief Complaint:   OBESITY Jessica Frye is here to discuss her progress with her obesity treatment plan along with follow-up of her obesity related diagnoses. Jessica Frye is on keeping a food journal and adhering to recommended goals of 1400 calories and 85+ grams of protein and states she is following her eating plan approximately 90% of the time. Jessica Frye states she is walking and at the gym for 25-35 minutes 4-5 times per week.  Today's visit was #: 21 Starting weight: 186 lbs Starting date: 09/22/2020 Today's weight: 146 lbs Today's date: 05/10/2022 Total lbs lost to date: 40 Total lbs lost since last in-office visit: 0  Interim History: Patient voices she has been drinking water all day at work and is wondering if that is the change on the scale.  She has been following her meal plan and working out consistently.  She has been doing heavier weights now and thinks that she will now notice more change.  Thinking you may go to [place] in June and then [place] either before or after.  Her daughter finishes her college year next week.  Subjective:   1. Vitamin D deficiency Jessica Frye is on prescription vitamin D.  She denies nausea, vomiting, or muscle weakness, but she notes fatigue.  Assessment/Plan:   1. Vitamin D deficiency We will refill prescription vitamin D for 1 month.  - Vitamin D, Ergocalciferol, (DRISDOL) 1.25 MG (50000 UNIT) CAPS capsule; Take 1 capsule (50,000 Units total) by mouth every 7 (seven) days.  Dispense: 4 capsule; Refill: 0  2. BMI 26.0-26.9,adult  3. Obesity with starting BMI of 34.0 Jessica Frye is currently in the action stage of change. As such, her goal is to continue with weight loss efforts. She has agreed to keeping a food journal and adhering to recommended goals of 1450 calories and 85+ grams of protein daily.   Exercise goals: Patient is to increase resistance training.  Behavioral modification strategies: increasing lean protein intake, meal planning and  cooking strategies, keeping healthy foods in the home, and planning for success.  Jessica Frye has agreed to follow-up with our clinic in 8 weeks. She was informed of the importance of frequent follow-up visits to maximize her success with intensive lifestyle modifications for her multiple health conditions.   Objective:   Blood pressure 118/74, pulse 67, temperature 98.6 F (37 C), height 5\' 2"  (1.575 m), weight 146 lb (66.2 kg), SpO2 99 %. Body mass index is 26.7 kg/m.  General: Cooperative, alert, well developed, in no acute distress. HEENT: Conjunctivae and lids unremarkable. Cardiovascular: Regular rhythm.  Lungs: Normal work of breathing. Neurologic: No focal deficits.   Lab Results  Component Value Date   CREATININE 0.69 02/16/2022   BUN 11 02/16/2022   NA 140 02/16/2022   K 4.6 02/16/2022   CL 104 02/16/2022   CO2 23 02/16/2022   Lab Results  Component Value Date   ALT 12 02/16/2022   AST 12 02/16/2022   ALKPHOS 70 02/16/2022   BILITOT 0.3 02/16/2022   Lab Results  Component Value Date   HGBA1C 4.9 02/16/2022   HGBA1C 5.1 02/28/2021   Lab Results  Component Value Date   INSULIN 5.4 02/16/2022   INSULIN 10.2 02/28/2021   INSULIN 14.9 09/22/2020   No results found for: "TSH" Lab Results  Component Value Date   CHOL 185 02/16/2022   HDL 60 02/16/2022   LDLCALC 116 (H) 02/16/2022   TRIG 45 02/16/2022   Lab Results  Component Value Date  VD25OH 54.4 02/16/2022   VD25OH 38.2 02/28/2021   VD25OH 58.3 09/22/2020   Lab Results  Component Value Date   WBC 4.5 09/22/2020   HGB 13.2 09/22/2020   HCT 41.8 09/22/2020   MCV 89 09/22/2020   PLT 328 09/22/2020   No results found for: "IRON", "TIBC", "FERRITIN"  Attestation Statements:   Reviewed by clinician on day of visit: allergies, medications, problem list, medical history, surgical history, family history, social history, and previous encounter notes.   I, Burt Knack, am acting as transcriptionist  for Reuben Likes, MD. I have reviewed the above documentation for accuracy and completeness, and I agree with the above. - Reuben Likes, MD

## 2022-07-10 ENCOUNTER — Telehealth (INDEPENDENT_AMBULATORY_CARE_PROVIDER_SITE_OTHER): Payer: 59 | Admitting: Family Medicine

## 2022-07-10 ENCOUNTER — Encounter (INDEPENDENT_AMBULATORY_CARE_PROVIDER_SITE_OTHER): Payer: Self-pay | Admitting: Family Medicine

## 2022-07-10 VITALS — BP 106/68 | HR 81 | Temp 98.1°F | Ht 62.0 in | Wt 143.0 lb

## 2022-07-10 DIAGNOSIS — Z6826 Body mass index (BMI) 26.0-26.9, adult: Secondary | ICD-10-CM | POA: Diagnosis not present

## 2022-07-10 DIAGNOSIS — E669 Obesity, unspecified: Secondary | ICD-10-CM | POA: Diagnosis not present

## 2022-07-10 DIAGNOSIS — E7849 Other hyperlipidemia: Secondary | ICD-10-CM | POA: Diagnosis not present

## 2022-07-10 MED ORDER — WEGOVY 1.7 MG/0.75ML ~~LOC~~ SOAJ: 1.7000 mg | SUBCUTANEOUS | 0 refills | Status: DC

## 2022-07-10 NOTE — Progress Notes (Signed)
TeleHealth Visit:  Due to the COVID-19 pandemic, this visit was completed with telemedicine (audio/video) technology to reduce patient and provider exposure as well as to preserve personal protective equipment.   Jessica Frye has verbally consented to this TeleHealth visit. The provider is located at home, the patient is located at the [facility] office. The participants in this visit include the listed provider and patient. The visit was conducted today via MyChart video.   Chief Complaint: OBESITY Jessica Frye is here to discuss her progress with her obesity treatment plan along with follow-up of her obesity related diagnoses. Jessica Frye is on keeping a food journal and adhering to recommended goals of 1450 calories and 85+ grams of protein and states she is following her eating plan approximately 90% of the time. Jessica Frye states she is walking and at the gym for 45-60 minutes 3-4 times per week.  Today's visit was #: 22 Starting weight: 186 lbs Starting date: 09/22/2020 Today's weight: 143 lbs Today's date: 07/10/2022 Total lbs lost to date: 43 Total lbs lost since last in-office visit: 3  Interim History: Patient had a great couple of weeks- didn't do much for 4th of July due to the heat and humidity. She is doing a lot of fruit and salads to get lighter options due to the heat. She has been able to get all protein in even with lighter options. She is walking with co workers for half an hour to an hour and then going to the gym after work for about 45 minutes. Next few weeks she is planning a trip up to [place] at the end of the month and planning a trip in September but hasn't decided yet where she is going. 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  Subjective:   1. Other hyperlipidemia Patient's last LDL was still minimally elevated.  She is not on medications.  Assessment/Plan:   1. Other  hyperlipidemia Patient is to have repeat labs in the next 1 to 2 months either at [facility] or with her PCP.    2. BMI 26.0-26.9,adult  3. Obesity with starting BMI of 34.0 We will refill Wegovy 1.7 mg subcu weekly for 90 days.  - Semaglutide-Weight Management (WEGOVY) 1.7 MG/0.75ML SOAJ; Inject 1.7 mg into the skin once a week.  Dispense: 9 mL; Refill: 0  [name] is currently in the action stage of change. As such, her goal is to continue with weight loss efforts. She has agreed to keeping a food journal and adhering to recommended goals of 1400-1500 calories and 90+ grams of protein daily.   Exercise goals: As is.   Behavioral modification strategies: increasing lean protein intake, meal planning and cooking strategies, keeping healthy foods in the home, and planning for success.  [name] has agreed to follow-up with our clinic in 4 to 6 weeks. She was informed of the importance of frequent follow-up visits to maximize her success with intensive lifestyle modifications for her multiple health conditions.  Objective:   VITALS: Per patient if applicable, see vitals. GENERAL: Alert and in no acute distress. CARDIOPULMONARY: No increased WOB. Speaking in clear sentences.  PSYCH: Pleasant and cooperative. Speech normal rate and rhythm. Affect is appropriate. Insight and judgement are appropriate. Attention is focused, linear, and appropriate.  NEURO: Oriented as arrived to appointment on time with no prompting.   Lab Results  Component Value Date   CREATININE 0.69 02/16/2022  BUN 11 02/16/2022   NA 140 02/16/2022   K 4.6 02/16/2022   CL 104 02/16/2022   CO2 23 02/16/2022   Lab Results  Component Value Date   ALT 12 02/16/2022   AST 12 02/16/2022   ALKPHOS 70 02/16/2022   BILITOT 0.3 02/16/2022   Lab Results  Component Value Date   HGBA1C 4.9 02/16/2022   HGBA1C 5.1 02/28/2021   Lab Results  Component Value Date   INSULIN 5.4 02/16/2022   INSULIN 10.2  02/28/2021   INSULIN 14.9 09/22/2020   No results found for: "TSH" Lab Results  Component Value Date   CHOL 185 02/16/2022   HDL 60 02/16/2022   LDLCALC 116 (H) 02/16/2022   TRIG 45 02/16/2022   Lab Results  Component Value Date   VD25OH 54.4 02/16/2022   VD25OH 38.2 02/28/2021   VD25OH 58.3 09/22/2020   Lab Results  Component Value Date   WBC 4.5 09/22/2020   HGB 13.2 09/22/2020   HCT 41.8 09/22/2020   MCV 89 09/22/2020   PLT 328 09/22/2020   No results found for: "IRON", "TIBC", "FERRITIN"  Attestation Statements:   Reviewed by clinician on day of visit: allergies, medications, problem list, medical history, surgical history, family history, social history, and previous encounter notes.   I, Burt Knack, am acting as transcriptionist for Reuben Likes, MD.  I have reviewed the above documentation for accuracy and completeness, and I agree with the above. - Reuben Likes, MD

## 2022-08-04 ENCOUNTER — Encounter (INDEPENDENT_AMBULATORY_CARE_PROVIDER_SITE_OTHER): Payer: Self-pay | Admitting: Family Medicine

## 2022-08-07 NOTE — Telephone Encounter (Signed)
Please advise 

## 2022-08-15 ENCOUNTER — Other Ambulatory Visit (INDEPENDENT_AMBULATORY_CARE_PROVIDER_SITE_OTHER): Payer: Self-pay

## 2022-08-15 DIAGNOSIS — E669 Obesity, unspecified: Secondary | ICD-10-CM

## 2022-08-15 MED ORDER — ONDANSETRON HCL 4 MG PO TABS: 4.0000 mg | ORAL_TABLET | Freq: Three times a day (TID) | ORAL | 0 refills | Status: DC | PRN

## 2022-08-15 NOTE — Telephone Encounter (Signed)
Patient calling for an update on her nausea medication refill request from 8/11. She hasn't received any updates and would like to be updated today. Please Advise. JE

## 2022-09-12 ENCOUNTER — Other Ambulatory Visit: Payer: Self-pay | Admitting: Obstetrics and Gynecology

## 2022-09-12 DIAGNOSIS — Z1231 Encounter for screening mammogram for malignant neoplasm of breast: Secondary | ICD-10-CM

## 2022-10-02 ENCOUNTER — Ambulatory Visit (INDEPENDENT_AMBULATORY_CARE_PROVIDER_SITE_OTHER): Payer: 59 | Admitting: Family Medicine

## 2022-10-02 ENCOUNTER — Encounter (INDEPENDENT_AMBULATORY_CARE_PROVIDER_SITE_OTHER): Payer: Self-pay | Admitting: Family Medicine

## 2022-10-02 VITALS — BP 113/77 | HR 73 | Temp 97.8°F | Ht 62.0 in | Wt 143.0 lb

## 2022-10-02 DIAGNOSIS — E669 Obesity, unspecified: Secondary | ICD-10-CM

## 2022-10-02 DIAGNOSIS — E66811 Obesity, class 1: Secondary | ICD-10-CM

## 2022-10-02 DIAGNOSIS — Z6826 Body mass index (BMI) 26.0-26.9, adult: Secondary | ICD-10-CM | POA: Diagnosis not present

## 2022-10-02 DIAGNOSIS — E559 Vitamin D deficiency, unspecified: Secondary | ICD-10-CM

## 2022-10-02 DIAGNOSIS — R0602 Shortness of breath: Secondary | ICD-10-CM | POA: Diagnosis not present

## 2022-10-02 DIAGNOSIS — E7849 Other hyperlipidemia: Secondary | ICD-10-CM

## 2022-10-02 MED ORDER — VITAMIN D (ERGOCALCIFEROL) 1.25 MG (50000 UNIT) PO CAPS: 50000.0000 [IU] | ORAL_CAPSULE | ORAL | 0 refills | Status: DC

## 2022-10-02 MED ORDER — WEGOVY 1.7 MG/0.75ML ~~LOC~~ SOAJ
1.7000 mg | SUBCUTANEOUS | 0 refills | Status: DC
Start: 2022-10-02 — End: 2023-01-16

## 2022-10-02 NOTE — Progress Notes (Signed)
Chief Complaint:   OBESITY Jessica Frye is here to discuss her progress with her obesity treatment plan along with follow-up of her obesity related diagnoses. Jessica Frye is on keeping a food journal and adhering to recommended goals of 1400-1500 calories and 90+ grams of protein and states she is following her eating plan approximately 85% of the time. Jessica Frye states she is at the gym and walking for 30-40 minutes 4-5 times per week.  Today's visit was #: 23 Starting weight: 186 lbs Starting date: 09/22/2020 Today's weight: 143 lbs Today's date: 10/02/2022 Total lbs lost to date: 43 Total lbs lost since last in-office visit: 0  Interim History: Patient last seen virtually early July.  She had a good rest of the summer.  She has been staying consistent with her journaling.  She has been focused on protein and getting in salad with protein frequently. Over the next few weeks she is planning to work and just focus on normal life.  She is planning a cruise in March for her daughters 21st birthday.   Subjective:   1. Vitamin D deficiency Patient is on prescription vitamin D, and she notes fatigue.  2. Other hyperlipidemia Patient's last LDL was 118 (stable from previously) and HDL 60.  3. SOB (shortness of breath) [R06.02] Patient's previous IC was over 1500.  She has lost 43 pounds and has implemented resistance training.  Assessment/Plan:   1. Vitamin D deficiency We will refill prescription vitamin D 50,000 IU once weekly for 90 days.  - Vitamin D, Ergocalciferol, (DRISDOL) 1.25 MG (50000 UNIT) CAPS capsule; Take 1 capsule (50,000 Units total) by mouth every 7 (seven) days.  Dispense: 12 capsule; Refill: 0  2. Other hyperlipidemia We will repeat labs in 6 months; stable from previous but not yet at goal.  3. SOB (shortness of breath) [R06.02] IC today was significantly lower, and we will decrease calorie budget and increase intensity of resistance training.  4. BMI  26.0-26.9,adult  5. Obesity with starting BMI of 34.0 We will refill Wegovy 1.7 mg subcu weekly for 90 days.  - Semaglutide-Weight Management (WEGOVY) 1.7 MG/0.75ML SOAJ; Inject 1.7 mg into the skin once a week.  Dispense: 9 mL; Refill: 0  Jessica Frye is currently in the action stage of change. As such, her goal is to continue with weight loss efforts. She has agreed to keeping a food journal and adhering to recommended goals of 1250-1350 calories and 85+ grams of protein daily.   Exercise goals: Patient is to increase resistance training intensity.  Behavioral modification strategies: increasing lean protein intake, meal planning and cooking strategies, keeping healthy foods in the home, and planning for success.  Jessica Frye has agreed to follow-up with our clinic in 12 weeks. She was informed of the importance of frequent follow-up visits to maximize her success with intensive lifestyle modifications for her multiple health conditions.   Objective:   Blood pressure 113/77, pulse 73, temperature 97.8 F (36.6 C), height 5\' 2"  (1.575 m), weight 143 lb (64.9 kg), SpO2 100%. Body mass index is 26.16 kg/m.  General: Cooperative, alert, well developed, in no acute distress. HEENT: Conjunctivae and lids unremarkable. Cardiovascular: Regular rhythm.  Lungs: Normal work of breathing. Neurologic: No focal deficits.   Lab Results  Component Value Date   CREATININE 0.69 02/16/2022   BUN 11 02/16/2022   NA 140 02/16/2022   K 4.6 02/16/2022   CL 104 02/16/2022   CO2 23 02/16/2022   Lab Results  Component Value Date  ALT 12 02/16/2022   AST 12 02/16/2022   ALKPHOS 70 02/16/2022   BILITOT 0.3 02/16/2022   Lab Results  Component Value Date   HGBA1C 4.9 02/16/2022   HGBA1C 5.1 02/28/2021   Lab Results  Component Value Date   INSULIN 5.4 02/16/2022   INSULIN 10.2 02/28/2021   INSULIN 14.9 09/22/2020   No results found for: "TSH" Lab Results  Component Value Date   CHOL 185 02/16/2022    HDL 60 02/16/2022   LDLCALC 116 (H) 02/16/2022   TRIG 45 02/16/2022   Lab Results  Component Value Date   VD25OH 54.4 02/16/2022   VD25OH 38.2 02/28/2021   VD25OH 58.3 09/22/2020   Lab Results  Component Value Date   WBC 4.5 09/22/2020   HGB 13.2 09/22/2020   HCT 41.8 09/22/2020   MCV 89 09/22/2020   PLT 328 09/22/2020   No results found for: "IRON", "TIBC", "FERRITIN"  Attestation Statements:   Reviewed by clinician on day of visit: allergies, medications, problem list, medical history, surgical history, family history, social history, and previous encounter notes.   I, Burt Knack, am acting as transcriptionist for Reuben Likes, MD.  I have reviewed the above documentation for accuracy and completeness, and I agree with the above. - Reuben Likes, MD

## 2022-10-05 ENCOUNTER — Ambulatory Visit
Admission: RE | Admit: 2022-10-05 | Discharge: 2022-10-05 | Disposition: A | Discharge status: Home / Self Care | Payer: 59 | Source: Ambulatory Visit | Attending: Obstetrics and Gynecology | Admitting: Obstetrics and Gynecology

## 2022-10-05 DIAGNOSIS — Z1231 Encounter for screening mammogram for malignant neoplasm of breast: Secondary | ICD-10-CM

## 2022-12-20 ENCOUNTER — Other Ambulatory Visit (INDEPENDENT_AMBULATORY_CARE_PROVIDER_SITE_OTHER): Payer: Self-pay | Admitting: Family Medicine

## 2022-12-20 DIAGNOSIS — E559 Vitamin D deficiency, unspecified: Secondary | ICD-10-CM

## 2023-01-01 ENCOUNTER — Ambulatory Visit (INDEPENDENT_AMBULATORY_CARE_PROVIDER_SITE_OTHER): Payer: 59 | Admitting: Family Medicine

## 2023-01-11 ENCOUNTER — Other Ambulatory Visit (INDEPENDENT_AMBULATORY_CARE_PROVIDER_SITE_OTHER): Payer: Self-pay | Admitting: Family Medicine

## 2023-01-11 DIAGNOSIS — E66811 Obesity, class 1: Secondary | ICD-10-CM

## 2023-01-16 ENCOUNTER — Encounter (INDEPENDENT_AMBULATORY_CARE_PROVIDER_SITE_OTHER): Payer: Self-pay | Admitting: Family Medicine

## 2023-01-16 ENCOUNTER — Ambulatory Visit (INDEPENDENT_AMBULATORY_CARE_PROVIDER_SITE_OTHER): Payer: 59 | Admitting: Family Medicine

## 2023-01-16 VITALS — BP 119/74 | HR 71 | Temp 98.4°F | Ht 62.0 in | Wt 142.0 lb

## 2023-01-16 DIAGNOSIS — Z6825 Body mass index (BMI) 25.0-25.9, adult: Secondary | ICD-10-CM | POA: Diagnosis not present

## 2023-01-16 DIAGNOSIS — E785 Hyperlipidemia, unspecified: Secondary | ICD-10-CM

## 2023-01-16 DIAGNOSIS — E7800 Pure hypercholesterolemia, unspecified: Secondary | ICD-10-CM

## 2023-01-16 DIAGNOSIS — E559 Vitamin D deficiency, unspecified: Secondary | ICD-10-CM | POA: Diagnosis not present

## 2023-01-16 DIAGNOSIS — E669 Obesity, unspecified: Secondary | ICD-10-CM

## 2023-01-16 DIAGNOSIS — Z6834 Body mass index (BMI) 34.0-34.9, adult: Secondary | ICD-10-CM

## 2023-01-16 MED ORDER — VITAMIN D (ERGOCALCIFEROL) 1.25 MG (50000 UNIT) PO CAPS: 50000.0000 [IU] | ORAL_CAPSULE | ORAL | 0 refills | Status: DC

## 2023-01-16 MED ORDER — WEGOVY 1.7 MG/0.75ML ~~LOC~~ SOAJ: 1.7000 mg | SUBCUTANEOUS | 0 refills | Status: DC

## 2023-01-16 NOTE — Progress Notes (Signed)
   SUBJECTIVE:  Chief Complaint: Obesity  Interim History: Patient has been back for follow up.  She mentions with the holidays she was a bit off plan.  Her daughter is about to be 21.  She is back on her daily routine of packing lunch, having a snack and eating dinner all on plan.  She realizes she was off plan for a bit over the holidays.  She has recommitted to the gym recently.  Jessica Frye is here to discuss her progress with her obesity treatment plan. She is on the keeping a food journal and adhering to recommended goals of 1250-1350 calories and 85+ grams of protein and states she is following her eating plan approximately 50 % of the time. She states she is exercising 35 minutes 2 times per week.   OBJECTIVE: Visit Diagnoses: Problem List Items Addressed This Visit       Other   Vitamin D  deficiency   Vitamin D  level was drawn by patient's primary care physician in September with a level of 37.4.  Has been on prescription strength vitamin D  and needs a refill today.  She denies nausea, vomiting, muscle weakness.      Relevant Medications   Vitamin D , Ergocalciferol , (DRISDOL ) 1.25 MG (50000 UNIT) CAPS capsule   Hyperlipidemia - Primary   Last LDL of 116.  Patient has been making significant lifestyle changes.  Will get repeat labs with PCP.      Other Visit Diagnoses       Obesity with starting BMI of 34.0       Relevant Medications   Semaglutide -Weight Management (WEGOVY ) 1.7 MG/0.75ML SOAJ       No data recorded No data recorded  No data recorded No data recorded    ASSESSMENT AND PLAN:  Diet: Jessica Frye is currently in the action stage of change. As such, her goal is to continue with weight loss efforts. She has agreed to keeping a food journal and adhering to recommended goals of 1250-1350 calories and 85 or more grams of protein daily.  Exercise: Jessica Frye has been instructed that some exercise is better than none and to continue exercising as is for weight loss  and overall health benefits.   Behavior Modification:  We discussed the following Behavioral Modification Strategies today: increasing lean protein intake, decreasing simple carbohydrates, no skipping meals, better snacking choices, avoiding temptations, and planning for success. We discussed various medication options to help Jessica Frye with her weight loss efforts and we both agreed to continue Wegovy  at 1.7mg  weekly.  And has done very well on this medication and is still in the maintenance phase of her weight loss.  She is close to her goal weight but given the recent holiday season she has done exceptionally well at maintaining.  No follow-ups on file.Jessica Frye She was informed of the importance of frequent follow up visits to maximize her success with intensive lifestyle modifications for her multiple health conditions.  Attestation Statements:   Reviewed by clinician on day of visit: allergies, medications, problem list, medical history, surgical history, family history, social history, and previous encounter notes.      Adelita Cho, MD

## 2023-01-17 NOTE — Telephone Encounter (Signed)
 PA sent to plan 01/17/23-CS

## 2023-01-21 DIAGNOSIS — E785 Hyperlipidemia, unspecified: Secondary | ICD-10-CM | POA: Insufficient documentation

## 2023-01-21 DIAGNOSIS — E559 Vitamin D deficiency, unspecified: Secondary | ICD-10-CM | POA: Insufficient documentation

## 2023-01-21 NOTE — Assessment & Plan Note (Signed)
Vitamin D level was drawn by patient's primary care physician in September with a level of 37.4.  Has been on prescription strength vitamin D and needs a refill today.  She denies nausea, vomiting, muscle weakness.

## 2023-01-21 NOTE — Assessment & Plan Note (Signed)
Last LDL of 116.  Patient has been making significant lifestyle changes.  Will get repeat labs with PCP.

## 2023-01-31 ENCOUNTER — Ambulatory Visit (INDEPENDENT_AMBULATORY_CARE_PROVIDER_SITE_OTHER): Payer: 59 | Admitting: Family Medicine

## 2023-04-10 ENCOUNTER — Ambulatory Visit (INDEPENDENT_AMBULATORY_CARE_PROVIDER_SITE_OTHER): Payer: 59 | Admitting: Family Medicine

## 2023-04-10 ENCOUNTER — Encounter (INDEPENDENT_AMBULATORY_CARE_PROVIDER_SITE_OTHER): Payer: Self-pay | Admitting: Family Medicine

## 2023-04-10 VITALS — BP 115/74 | HR 72 | Temp 98.0°F | Ht 62.0 in | Wt 147.0 lb

## 2023-04-10 DIAGNOSIS — E559 Vitamin D deficiency, unspecified: Secondary | ICD-10-CM | POA: Diagnosis not present

## 2023-04-10 DIAGNOSIS — Z6834 Body mass index (BMI) 34.0-34.9, adult: Secondary | ICD-10-CM

## 2023-04-10 DIAGNOSIS — E88819 Insulin resistance, unspecified: Secondary | ICD-10-CM

## 2023-04-10 DIAGNOSIS — E7800 Pure hypercholesterolemia, unspecified: Secondary | ICD-10-CM

## 2023-04-10 DIAGNOSIS — Z6826 Body mass index (BMI) 26.0-26.9, adult: Secondary | ICD-10-CM

## 2023-04-10 DIAGNOSIS — E785 Hyperlipidemia, unspecified: Secondary | ICD-10-CM

## 2023-04-10 DIAGNOSIS — E66811 Obesity, class 1: Secondary | ICD-10-CM

## 2023-04-10 MED ORDER — VITAMIN D (ERGOCALCIFEROL) 1.25 MG (50000 UNIT) PO CAPS: 50000.0000 [IU] | ORAL_CAPSULE | ORAL | 0 refills | Status: DC

## 2023-04-10 MED ORDER — ONDANSETRON HCL 4 MG PO TABS
4.0000 mg | ORAL_TABLET | Freq: Three times a day (TID) | ORAL | 2 refills | Status: DC | PRN
Start: 2023-04-10 — End: 2023-10-04

## 2023-04-10 MED ORDER — WEGOVY 1.7 MG/0.75ML ~~LOC~~ SOAJ: 1.7000 mg | SUBCUTANEOUS | 0 refills | Status: DC

## 2023-04-10 NOTE — Progress Notes (Signed)
 SUBJECTIVE:  Chief Complaint: Obesity  Interim History: Patient has been going back and forth from [place]  a few times in the last few months and is ready to stay put for a while.  She is planning to get back on plan over the upcoming weekend.  No big upcoming plans for the next three months.  She is trying to get her coworkers into the gym with her.   Eboney is here to discuss her progress with her obesity treatment plan. She is on the keeping a food journal and adhering to recommended goals of 1250-1350 calories and 85 grams of protein and states she is following her eating plan approximately 50 % of the time. She states she is exercising 35 minutes 2 times per week.   OBJECTIVE: Visit Diagnoses: Problem List Items Addressed This Visit       Endocrine   Insulin resistance   Repeat labs today of a comprehensive metabolic panel, hemoglobin A1c, and fasting insulin level done today.  Will plan to discuss lab results at next appointment.  Patient is been stable on GLP-1 injectable medications for quite some time.  She is working on weight loss maintenance at this time.      Relevant Orders   Comprehensive metabolic panel with GFR   Hemoglobin A1c   Insulin, random     Other   Vitamin D deficiency   Patient is on prescription strength vitamin D and needs a refill today.  She is also going to get a repeat vitamin D level to see where her level is with her current replacement regimen.      Relevant Medications   Vitamin D, Ergocalciferol, (DRISDOL) 1.25 MG (50000 UNIT) CAPS capsule   Other Relevant Orders   VITAMIN D 25 Hydroxy (Vit-D Deficiency, Fractures)   Hyperlipidemia - Primary   Fasting lipid panel ordered today.  Will plan to discuss lab results at next appointment.      Relevant Orders   Lipid Panel With LDL/HDL Ratio   Class 1 obesity with serious comorbidity and body mass index (BMI) of 34.0 to 34.9 in adult   Anthropometric Measurements Height: 5\' 2"  (1.575  m) Weight: 147 lb (66.7 kg) BMI (Calculated): 26.88 Weight at Last Visit: 142 lb Weight Lost Since Last Visit: 0 Weight Gained Since Last Visit: 5 Starting Weight: 186 lb Total Weight Loss (lbs): 39 lb (17.7 kg) Body Composition  Body Fat %: 33.6 % Fat Mass (lbs): 49.6 lbs Muscle Mass (lbs): 93 lbs Total Body Water (lbs): 64.6 lbs Visceral Fat Rating : 7 Other Clinical Data Fasting: no Labs: no Today's Visit #: 25 Starting Date: 09/22/20 Comments: 1250-1350/85       Relevant Medications   Semaglutide-Weight Management (WEGOVY) 1.7 MG/0.75ML SOAJ   ondansetron (ZOFRAN) 4 MG tablet   Other Visit Diagnoses       BMI 26.0-26.9,adult           No data recorded      04/10/2023    8:00 AM 01/16/2023    3:00 PM 10/02/2022    8:00 AM  Vitals with BMI  Height 5\' 2"  5\' 2"  5\' 2"   Weight 147 lbs 142 lbs 143 lbs  BMI 26.88 25.97 26.15  Systolic 115 119 829  Diastolic 74 74 77  Pulse 72 71 73       ASSESSMENT AND PLAN:  Diet: Stephnie is currently in the action stage of change. As such, her goal is to continue with weight loss efforts and  has agreed to keeping a food journal and adhering to recommended goals of 1250-1350 calories and 85 or more grams protein daily.   Exercise:  For substantial health benefits, adults should do at least 150 minutes (2 hours and 30 minutes) a week of moderate-intensity, or 75 minutes (1 hour and 15 minutes) a week of vigorous-intensity aerobic physical activity, or an equivalent combination of moderate- and vigorous-intensity aerobic activity. Aerobic activity should be performed in episodes of at least 10 minutes, and preferably, it should be spread throughout the week.  Behavior Modification:  We discussed the following Behavioral Modification Strategies today: increasing lean protein intake, decreasing simple carbohydrates, increasing vegetables, meal planning and cooking strategies, avoiding temptations, planning for success, and keep a  strict food journal. We discussed various medication options to help Giana with her weight loss efforts and we both agreed to continue [facility] 1.7mg  weekly.  Return in about 3 months (around 07/10/2023).Aaron Aas She was informed of the importance of frequent follow up visits to maximize her success with intensive lifestyle modifications for her multiple health conditions.  Attestation Statements:   Reviewed by clinician on day of visit: allergies, medications, problem list, medical history, surgical history, family history, social history, and previous encounter notes.    Donaciano Frizzle, MD

## 2023-04-18 DIAGNOSIS — E88819 Insulin resistance, unspecified: Secondary | ICD-10-CM | POA: Insufficient documentation

## 2023-04-18 DIAGNOSIS — Z6834 Body mass index (BMI) 34.0-34.9, adult: Secondary | ICD-10-CM | POA: Insufficient documentation

## 2023-04-18 NOTE — Assessment & Plan Note (Signed)
 Repeat labs today of a comprehensive metabolic panel, hemoglobin A1c, and fasting insulin level done today.  Will plan to discuss lab results at next appointment.  Patient is been stable on GLP-1 injectable medications for quite some time.  She is working on weight loss maintenance at this time.

## 2023-04-18 NOTE — Assessment & Plan Note (Signed)
 Patient is on prescription strength vitamin D and needs a refill today.  She is also going to get a repeat vitamin D level to see where her level is with her current replacement regimen.

## 2023-04-18 NOTE — Assessment & Plan Note (Signed)
 Anthropometric Measurements Height: 5\' 2"  (1.575 m) Weight: 147 lb (66.7 kg) BMI (Calculated): 26.88 Weight at Last Visit: 142 lb Weight Lost Since Last Visit: 0 Weight Gained Since Last Visit: 5 Starting Weight: 186 lb Total Weight Loss (lbs): 39 lb (17.7 kg) Body Composition  Body Fat %: 33.6 % Fat Mass (lbs): 49.6 lbs Muscle Mass (lbs): 93 lbs Total Body Water (lbs): 64.6 lbs Visceral Fat Rating : 7 Other Clinical Data Fasting: no Labs: no Today's Visit #: 25 Starting Date: 09/22/20 Comments: 7846-9629/52

## 2023-04-18 NOTE — Assessment & Plan Note (Signed)
 Fasting lipid panel ordered today.  Will plan to discuss lab results at next appointment.

## 2023-05-07 ENCOUNTER — Other Ambulatory Visit: Payer: Self-pay | Admitting: Family Medicine

## 2023-05-07 DIAGNOSIS — M5412 Radiculopathy, cervical region: Secondary | ICD-10-CM

## 2023-05-18 ENCOUNTER — Ambulatory Visit
Admission: RE | Admit: 2023-05-18 | Discharge: 2023-05-18 | Disposition: A | Discharge status: Home / Self Care | Source: Ambulatory Visit | Attending: Family Medicine | Admitting: Family Medicine

## 2023-05-18 DIAGNOSIS — M5412 Radiculopathy, cervical region: Secondary | ICD-10-CM

## 2023-06-02 ENCOUNTER — Other Ambulatory Visit

## 2023-06-03 ENCOUNTER — Ambulatory Visit
Admission: RE | Admit: 2023-06-03 | Discharge: 2023-06-03 | Disposition: A | Discharge status: Home / Self Care | Source: Ambulatory Visit | Attending: Family Medicine | Admitting: Family Medicine

## 2023-06-07 ENCOUNTER — Encounter (INDEPENDENT_AMBULATORY_CARE_PROVIDER_SITE_OTHER): Payer: Self-pay | Admitting: Family Medicine

## 2023-07-12 ENCOUNTER — Encounter (INDEPENDENT_AMBULATORY_CARE_PROVIDER_SITE_OTHER): Payer: Self-pay | Admitting: Family Medicine

## 2023-07-12 ENCOUNTER — Ambulatory Visit (INDEPENDENT_AMBULATORY_CARE_PROVIDER_SITE_OTHER): Admitting: Family Medicine

## 2023-07-12 VITALS — BP 105/69 | HR 64 | Temp 98.3°F | Ht 62.0 in | Wt 145.0 lb

## 2023-07-12 DIAGNOSIS — E669 Obesity, unspecified: Secondary | ICD-10-CM

## 2023-07-12 DIAGNOSIS — E559 Vitamin D deficiency, unspecified: Secondary | ICD-10-CM

## 2023-07-12 DIAGNOSIS — Z6826 Body mass index (BMI) 26.0-26.9, adult: Secondary | ICD-10-CM

## 2023-07-12 DIAGNOSIS — M542 Cervicalgia: Secondary | ICD-10-CM | POA: Diagnosis not present

## 2023-07-12 DIAGNOSIS — E66811 Obesity, class 1: Secondary | ICD-10-CM

## 2023-07-12 MED ORDER — WEGOVY 1.7 MG/0.75ML ~~LOC~~ SOAJ: 1.7000 mg | SUBCUTANEOUS | 0 refills | Status: DC

## 2023-07-12 MED ORDER — VITAMIN D (ERGOCALCIFEROL) 1.25 MG (50000 UNIT) PO CAPS: 50000.0000 [IU] | ORAL_CAPSULE | ORAL | 0 refills | Status: DC

## 2023-07-12 NOTE — Progress Notes (Signed)
 SUBJECTIVE:  Chief Complaint: Obesity  Interim History: Patient has been running around most of the summer.  She is ready to stay in one place.  She has traveled up to [place] and [place] since last appointment.  She doesn't have any plans for travel until September.  She mentions she may go to [place]  at the end of July or beginning of August.   Jessica Frye is here to discuss her progress with her obesity treatment plan. She is on the keeping a food journal and adhering to recommended goals of 1250-1350 calories and 85 grams of protein and states she is following her eating plan approximately 70 % of the time. She states she is exercising 30-35 minutes 5 times per week.   OBJECTIVE: Visit Diagnoses: Problem List Items Addressed This Visit       Other   Vitamin D  deficiency - Primary   Relevant Medications   Vitamin D , Ergocalciferol , (DRISDOL ) 1.25 MG (50000 UNIT) CAPS capsule   Class 1 obesity with serious comorbidity and body mass index (BMI) of 34.0 to 34.9 in adult   Relevant Medications   Semaglutide -Weight Management (WEGOVY ) 1.7 MG/0.75ML SOAJ   Other Visit Diagnoses       Cervical pain (neck)         BMI 26.0-26.9,adult           No data recorded     07/12/2023    8:00 AM 04/10/2023    8:00 AM 01/16/2023    3:00 PM  Vitals with BMI  Height 5' 2 5' 2 5' 2  Weight 145 lbs 147 lbs 142 lbs  BMI 26.51 26.88 25.97  Systolic 105 115 880  Diastolic 69 74 74  Pulse 64 72 71        ASSESSMENT AND PLAN: Assessment & Plan Vitamin D  deficiency Patient on prescription strength Vitamin D  previously and denies nausea, vomiting or muscle weakness.  Needs a refill of prescription strength Vitamin D  and repeat lab eval at time of next appointment Cervical pain (neck) Patient underwent evaluation for cervical neck pain which she believes was positional given the location of her computer at work and her posture at that computer.  Since making changes to that her neck pain has  improved.  She was evaluated by ortho who discussed surgical options but patient deferred that at this time. Obesity with starting BMI of 34.0 Anthropometric Measurements Height: 5' 2 (1.575 m) Weight: 145 lb (65.8 kg) BMI (Calculated): 26.51 Weight at Last Visit: 147 lb Weight Lost Since Last Visit: 2 Weight Gained Since Last Visit: 0 Starting Weight: 186 lb Total Weight Loss (lbs): 41 lb (18.6 kg) Body Composition  Body Fat %: 32.3 % Fat Mass (lbs): 46.8 lbs Muscle Mass (lbs): 93.2 lbs Total Body Water (lbs): 63.6 lbs Visceral Fat Rating : 6 Other Clinical Data Fasting: yes Labs: no Today's Visit #: 26 Starting Date: 09/22/20 Comments: 1250-1350/85  BMI 26.0-26.9,adult    Diet: Jessica Frye is currently in the action stage of change. As such, her goal is to continue with weight loss efforts and has agreed to keeping a food journal and adhering to recommended goals of 1250-1350 calories and 85 or more grams of protein.   Exercise:  For additional and more extensive health benefits, adults should increase their aerobic physical activity to 300 minutes (5 hours) a week of moderate-intensity, or 150 minutes a week of vigorous-intensity aerobic physical activity, or an equivalent combination of moderate- and vigorous-intensity activity. Additional health benefits are gained  by engaging in physical activity beyond this amount.   Behavior Modification:  We discussed the following Behavioral Modification Strategies today: increasing lean protein intake, decreasing simple carbohydrates, increasing vegetables, meal planning and cooking strategies, and keep a strict food journal. We discussed various medication options to help Jessica Frye with her weight loss efforts and we both agreed to continue wegovy  at current dosage with no change.  No follow-ups on file.   She was informed of the importance of frequent follow up visits to maximize her success with intensive lifestyle modifications for her  multiple health conditions.  Attestation Statements:   Reviewed by clinician on day of visit: allergies, medications, problem list, medical history, surgical history, family history, social history, and previous encounter notes.     Jessica Cho, MD

## 2023-07-18 NOTE — Assessment & Plan Note (Signed)
 Patient on prescription strength Vitamin D  previously and denies nausea, vomiting or muscle weakness.  Needs a refill of prescription strength Vitamin D  and repeat lab eval at time of next appointment

## 2023-07-18 NOTE — Assessment & Plan Note (Signed)
 Anthropometric Measurements Height: 5' 2 (1.575 m) Weight: 145 lb (65.8 kg) BMI (Calculated): 26.51 Weight at Last Visit: 147 lb Weight Lost Since Last Visit: 2 Weight Gained Since Last Visit: 0 Starting Weight: 186 lb Total Weight Loss (lbs): 41 lb (18.6 kg) Body Composition  Body Fat %: 32.3 % Fat Mass (lbs): 46.8 lbs Muscle Mass (lbs): 93.2 lbs Total Body Water (lbs): 63.6 lbs Visceral Fat Rating : 6 Other Clinical Data Fasting: yes Labs: no Today's Visit #: 26 Starting Date: 09/22/20 Comments: 8749-8649/14

## 2023-08-16 ENCOUNTER — Other Ambulatory Visit: Payer: Self-pay | Admitting: Obstetrics and Gynecology

## 2023-08-16 DIAGNOSIS — Z1231 Encounter for screening mammogram for malignant neoplasm of breast: Secondary | ICD-10-CM

## 2023-10-04 ENCOUNTER — Encounter (INDEPENDENT_AMBULATORY_CARE_PROVIDER_SITE_OTHER): Payer: Self-pay | Admitting: Family Medicine

## 2023-10-04 ENCOUNTER — Ambulatory Visit (INDEPENDENT_AMBULATORY_CARE_PROVIDER_SITE_OTHER): Admitting: Family Medicine

## 2023-10-04 VITALS — BP 110/70 | HR 75 | Temp 98.4°F | Ht 62.0 in | Wt 146.0 lb

## 2023-10-04 DIAGNOSIS — Z6826 Body mass index (BMI) 26.0-26.9, adult: Secondary | ICD-10-CM

## 2023-10-04 DIAGNOSIS — E559 Vitamin D deficiency, unspecified: Secondary | ICD-10-CM

## 2023-10-04 DIAGNOSIS — E66811 Obesity, class 1: Secondary | ICD-10-CM

## 2023-10-04 MED ORDER — VITAMIN D (ERGOCALCIFEROL) 1.25 MG (50000 UNIT) PO CAPS
50000.0000 [IU] | ORAL_CAPSULE | ORAL | 0 refills | Status: AC
Start: 2023-10-04 — End: ?

## 2023-10-04 MED ORDER — WEGOVY 2.4 MG/0.75ML ~~LOC~~ SOAJ: 2.4000 mg | SUBCUTANEOUS | 0 refills | Status: DC

## 2023-10-04 MED ORDER — ONDANSETRON HCL 4 MG PO TABS: 4.0000 mg | ORAL_TABLET | Freq: Three times a day (TID) | ORAL | 2 refills | Status: DC | PRN

## 2023-10-04 NOTE — Progress Notes (Signed)
 SUBJECTIVE:  Chief Complaint: Obesity  Interim History: Patient here for 10 week follow up.  She went away a few times over the end of the summer.  She went to [place]  to help her friend move her daughter in.  She is going to [place]  again tomorrow to drive her daughter to a concert.  She is finding time to do things.  She mentions she is eating all her food which she never did previously.  She never was able to finish her plate. Wondering if it is injection site difference.  No big upcoming changes, activities or travel.    Jessica Frye is here to discuss her progress with her obesity treatment plan. She is on the keeping a food journal and adhering to recommended goals of 1250-1350 calories and 85 grams of protein and states she is following her eating plan approximately 75 % of the time. She states she is exercising 30-40 minutes 3 times per week- she is planning to go to the gym a bit more.    OBJECTIVE: Visit Diagnoses: Problem List Items Addressed This Visit       Other   Vitamin D  deficiency   Relevant Medications   Vitamin D , Ergocalciferol , (DRISDOL ) 1.25 MG (50000 UNIT) CAPS capsule   Class 1 obesity with serious comorbidity and body mass index (BMI) of 34.0 to 34.9 in adult   Relevant Medications   semaglutide -weight management (WEGOVY ) 2.4 MG/0.75ML SOAJ SQ injection   ondansetron  (ZOFRAN ) 4 MG tablet   Other Visit Diagnoses       BMI 26.0-26.9,adult    -  Primary       Vitals Temp: 98.4 F (36.9 C) BP: 110/70 Pulse Rate: 75 SpO2: 100 %   Anthropometric Measurements Height: 5' 2 (1.575 m) Weight: 146 lb (66.2 kg) BMI (Calculated): 26.7 Weight at Last Visit: 145 lb Weight Lost Since Last Visit: 0 Weight Gained Since Last Visit: 1 Starting Weight: 186 lb Total Weight Loss (lbs): 40 lb (18.1 kg)   Body Composition  Body Fat %: 33.4 % Fat Mass (lbs): 48.8 lbs Muscle Mass (lbs): 92.2 lbs Total Body Water (lbs): 62.2 lbs Visceral Fat Rating : 7   Other  Clinical Data Fasting: yes Labs: no Today's Visit #: 27 Starting Date: 09/22/20 Comments: 1250-1350/85     ASSESSMENT AND PLAN: Assessment & Plan Vitamin D  deficiency Previous vitamin D  level close to goal but not there yet.  Needs a refill of her prescription strength vitamin D  at this time.  No nausea, vomiting, or muscle weakness reported.  Will need repeat labs either with primary care or with us  within the next 4 to 6 months. BMI 26.0-26.9,adult  Obesity with starting BMI of 34.0    Diet: Jessica Frye is currently in the action stage of change. As such, her goal is to continue with weight loss efforts and has agreed to keeping a food journal and adhering to recommended goals of 12 50-13 50 calories and 90 or more protein.   Exercise:  For substantial health benefits, adults should do at least 150 minutes (2 hours and 30 minutes) a week of moderate-intensity, or 75 minutes (1 hour and 15 minutes) a week of vigorous-intensity aerobic physical activity, or an equivalent combination of moderate- and vigorous-intensity aerobic activity. Aerobic activity should be performed in episodes of at least 10 minutes, and preferably, it should be spread throughout the week.  Patient to start riding her stationary bike 10 minutes a day for the next month then increase 5  minutes at a time.  Behavior Modification:  We discussed the following Behavioral Modification Strategies today: increasing lean protein intake, decreasing simple carbohydrates, increasing vegetables, meal planning and cooking strategies, planning for success, and keep a strict food journal. We discussed various medication options to help Jessica Frye with her weight loss efforts and we both agreed to increase Wegovy  to 2.7 mg weekly.  Given her increase in food desires as well as cravings patient may require a higher dose and so a 14-month prescription was sent in.  Will reevaluate food noise as well as intake at next appointment.  Zofran   additionally is being sent in in case patient has initial side effect of nausea with increase in dose.Jessica Frye  Return in about 11 weeks (around 12/20/2023).   She was informed of the importance of frequent follow up visits to maximize her success with intensive lifestyle modifications for her multiple health conditions.  Attestation Statements:   Reviewed by clinician on day of visit: allergies, medications, problem list, medical history, surgical history, family history, social history, and previous encounter notes.     Jessica Cho, MD

## 2023-10-04 NOTE — Assessment & Plan Note (Signed)
 Previous vitamin D  level close to goal but not there yet.  Needs a refill of her prescription strength vitamin D  at this time.  No nausea, vomiting, or muscle weakness reported.  Will need repeat labs either with primary care or with us  within the next 4 to 6 months.

## 2023-10-11 ENCOUNTER — Other Ambulatory Visit: Payer: Self-pay | Admitting: Medical Genetics

## 2023-10-12 ENCOUNTER — Ambulatory Visit
Admission: RE | Admit: 2023-10-12 | Discharge: 2023-10-12 | Disposition: A | Discharge status: Home / Self Care | Source: Ambulatory Visit | Attending: Obstetrics and Gynecology | Admitting: Obstetrics and Gynecology

## 2023-10-12 DIAGNOSIS — Z1231 Encounter for screening mammogram for malignant neoplasm of breast: Secondary | ICD-10-CM

## 2023-10-15 ENCOUNTER — Other Ambulatory Visit (INDEPENDENT_AMBULATORY_CARE_PROVIDER_SITE_OTHER): Payer: Self-pay | Admitting: Family Medicine

## 2023-10-15 ENCOUNTER — Other Ambulatory Visit (HOSPITAL_COMMUNITY)
Admission: RE | Admit: 2023-10-15 | Discharge: 2023-10-15 | Disposition: A | Discharge status: Home / Self Care | Payer: Self-pay | Source: Ambulatory Visit | Attending: Medical Genetics | Admitting: Medical Genetics

## 2023-10-15 DIAGNOSIS — E66811 Obesity, class 1: Secondary | ICD-10-CM

## 2023-10-15 DIAGNOSIS — E559 Vitamin D deficiency, unspecified: Secondary | ICD-10-CM

## 2023-10-23 LAB — GENECONNECT MOLECULAR SCREEN: Genetic Analysis Overall Interpretation: NEGATIVE

## 2023-12-20 ENCOUNTER — Ambulatory Visit (INDEPENDENT_AMBULATORY_CARE_PROVIDER_SITE_OTHER): Payer: Self-pay | Admitting: Family Medicine

## 2023-12-20 VITALS — BP 105/67 | HR 77 | Temp 97.9°F | Ht 62.0 in | Wt 144.0 lb

## 2023-12-20 DIAGNOSIS — E7800 Pure hypercholesterolemia, unspecified: Secondary | ICD-10-CM

## 2023-12-20 DIAGNOSIS — Z6834 Body mass index (BMI) 34.0-34.9, adult: Secondary | ICD-10-CM | POA: Diagnosis not present

## 2023-12-20 DIAGNOSIS — E559 Vitamin D deficiency, unspecified: Secondary | ICD-10-CM

## 2023-12-20 DIAGNOSIS — E66811 Obesity, class 1: Secondary | ICD-10-CM | POA: Diagnosis not present

## 2023-12-20 MED ORDER — WEGOVY 2.4 MG/0.75ML ~~LOC~~ SOAJ: 2.4000 mg | SUBCUTANEOUS | 0 refills | Status: DC

## 2023-12-20 MED ORDER — ONDANSETRON HCL 4 MG PO TABS: 4.0000 mg | ORAL_TABLET | Freq: Three times a day (TID) | ORAL | 2 refills | Status: AC | PRN

## 2023-12-20 MED ORDER — WEGOVY 2.4 MG/0.75ML ~~LOC~~ SOAJ: 2.4000 mg | SUBCUTANEOUS | 0 refills | Status: AC

## 2023-12-20 NOTE — Progress Notes (Signed)
 "  SUBJECTIVE:  Chief Complaint: Obesity  Interim History: patient here for 10 week follow up.  Life has been going well.  She is planning to go up to [facility] the day after Christmas until after New Years.  Eating and  following same calorie allotment and protein goal.  She has added in creatine for additional supplementation.  Jessica Frye is here to discuss her progress with her obesity treatment plan. She is on the keeping a food journal and adhering to recommended goals of 1250-1350 calories and 90 grams of protein and states she is following her eating plan approximately 85 % of the time. She states she is exercising 35-40 minutes 4 times per week. Working out at [employer] after work 4 times a week. When she is home she rides her bike.   OBJECTIVE: Visit Diagnoses: Problem List Items Addressed This Visit       Other   Vitamin D  deficiency - Primary   Relevant Medications   Vitamin D , Ergocalciferol , (DRISDOL ) 1.25 MG (50000 UNIT) CAPS capsule   Hyperlipidemia   Class 1 obesity with serious comorbidity and body mass index (BMI) of 34.0 to 34.9 in adult   Relevant Medications   semaglutide -weight management (WEGOVY ) 2.4 MG/0.75ML SOAJ SQ injection   ondansetron  (ZOFRAN ) 4 MG tablet    Vitals Temp: 97.9 F (36.6 C) BP: 105/67 Pulse Rate: 77 SpO2: 99 %   Anthropometric Measurements Height: 5' 2 (1.575 m) Weight: 144 lb (65.3 kg) BMI (Calculated): 26.33 Weight at Last Visit: 146 lb Weight Lost Since Last Visit: 2 Weight Gained Since Last Visit: 0 Starting Weight: 186 lb Total Weight Loss (lbs): 42 lb (19.1 kg)   Body Composition  Body Fat %: 33.5 % Fat Mass (lbs): 48.2 lbs Muscle Mass (lbs): 91 lbs Total Body Water (lbs): 63.6 lbs Visceral Fat Rating : 7   Other Clinical Data Today's Visit #: 27 Starting Date: 09/22/20 Comments: 1250-1350/90     ASSESSMENT AND PLAN: Assessment & Plan Vitamin D  deficiency Tolerating prescription strength vitamin D  with no nausea,  vomiting, or muscle weakness reported.  Refill of vitamin D  needed today.  Will follow-up on repeat labs in 3 to 4 months. Pure hypercholesterolemia Patient is working on limiting saturated fat intake and being mindful of total intake of cholesterol.  She continues to be successful with her lifestyle modifications with consistent physical activity.  Will need repeat labs in 4 to 6 months. Class 1 obesity with serious comorbidity and body mass index (BMI) of 34.0 to 34.9 in adult, unspecified obesity type  Obesity with starting BMI of 34.0    Diet: Jessica Frye is currently in the action stage of change. As such, her goal is to continue with weight loss efforts and has agreed to keeping a food journal and adhering to recommended goals of 1250-1350 calories and 90 or more grams of protein.   Exercise:  For substantial health benefits, adults should do at least 150 minutes (2 hours and 30 minutes) a week of moderate-intensity, or 75 minutes (1 hour and 15 minutes) a week of vigorous-intensity aerobic physical activity, or an equivalent combination of moderate- and vigorous-intensity aerobic activity. Aerobic activity should be performed in episodes of at least 10 minutes, and preferably, it should be spread throughout the week.  Behavior Modification:  We discussed the following Behavioral Modification Strategies today: increasing lean protein intake, decreasing simple carbohydrates, increasing vegetables, meal planning and cooking strategies, keeping healthy foods in the home, and planning for success. We discussed  various medication options to help Guila with her weight loss efforts and we both agreed to continue wegovy  at current dose.  Will refill Zofran  as well in case patient experiences GI side effects with current dose of Wegovy .  Return in about 11 weeks (around 03/06/2024).   She was informed of the importance of frequent follow up visits to maximize her success with intensive lifestyle  modifications for her multiple health conditions.  Attestation Statements:   Reviewed by clinician on day of visit: allergies, medications, problem list, medical history, surgical history, family history, social history, and previous encounter notes.    Jessica Cho, MD "

## 2023-12-24 ENCOUNTER — Telehealth (INDEPENDENT_AMBULATORY_CARE_PROVIDER_SITE_OTHER): Payer: Self-pay

## 2023-12-24 NOTE — Telephone Encounter (Signed)
 Wegovy , , is approved through 12/19/2024. Review by: , Pharm.D. WEGOVY  INJ 1.7MG , use as directed (3 per 28 days), is approved for 12 months through 12/19/2024 or until coverage for the medication is no longer available under your benefit plan or the medication becomes subject to a pharmacy benefit coverage requirement, such as supply limits or notification, whichever occurs first as allowed by law. Please note: Doses/quantities above plan limits and/or maximum Food and Drug Administration (FDA) approved dosing may be subject to further review.   *Please note: WEGOVY  INJ 1.7MG  has been approved one time for therapeutic duplication purposes for up to 3 per 28 days through 01/03/2024 or until coverage for the medication is no longer available under your benefit plan or the medication becomes subject to a pharmacy benefit coverage requirement, such as supply limits or notification, whichever occurs first as allowed by law.

## 2023-12-31 NOTE — Assessment & Plan Note (Signed)
 Tolerating prescription strength vitamin D  with no nausea, vomiting, or muscle weakness reported.  Refill of vitamin D  needed today.  Will follow-up on repeat labs in 3 to 4 months.

## 2023-12-31 NOTE — Assessment & Plan Note (Signed)
 Patient is working on limiting saturated fat intake and being mindful of total intake of cholesterol.  She continues to be successful with her lifestyle modifications with consistent physical activity.  Will need repeat labs in 4 to 6 months.

## 2024-01-16 ENCOUNTER — Encounter (INDEPENDENT_AMBULATORY_CARE_PROVIDER_SITE_OTHER): Payer: Self-pay | Admitting: Family Medicine

## 2024-02-28 ENCOUNTER — Ambulatory Visit (INDEPENDENT_AMBULATORY_CARE_PROVIDER_SITE_OTHER): Admitting: Family Medicine
